# Patient Record
Sex: Male | Born: 2000 | Hispanic: Yes | Marital: Single
Health system: Midwestern US, Community
[De-identification: ages and names within clinical notes are randomized; demographics above are authoritative.]

## PROBLEM LIST (undated history)

## (undated) HISTORY — PX: APPENDECTOMY: SHX54

---
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
## 2021-06-07 DIAGNOSIS — R079 Chest pain, unspecified: Secondary | ICD-10-CM | POA: Diagnosis not present

## 2021-06-07 DIAGNOSIS — Z00129 Encounter for routine child health examination without abnormal findings: Secondary | ICD-10-CM | POA: Diagnosis not present

## 2021-06-22 DIAGNOSIS — R0602 Shortness of breath: Secondary | ICD-10-CM | POA: Diagnosis not present

## 2021-06-22 DIAGNOSIS — R079 Chest pain, unspecified: Secondary | ICD-10-CM | POA: Diagnosis not present

## 2021-06-22 DIAGNOSIS — R61 Generalized hyperhidrosis: Secondary | ICD-10-CM | POA: Diagnosis not present

## 2021-07-02 DIAGNOSIS — I071 Rheumatic tricuspid insufficiency: Secondary | ICD-10-CM | POA: Diagnosis not present

## 2021-07-02 DIAGNOSIS — R079 Chest pain, unspecified: Secondary | ICD-10-CM | POA: Diagnosis not present

## 2021-07-20 DIAGNOSIS — R079 Chest pain, unspecified: Secondary | ICD-10-CM | POA: Diagnosis not present

## 2021-08-31 DIAGNOSIS — R079 Chest pain, unspecified: Secondary | ICD-10-CM | POA: Diagnosis not present

## 2021-09-03 DIAGNOSIS — R109 Unspecified abdominal pain: Secondary | ICD-10-CM | POA: Diagnosis not present

## 2021-09-03 DIAGNOSIS — R8290 Unspecified abnormal findings in urine: Secondary | ICD-10-CM | POA: Diagnosis not present

## 2021-09-03 DIAGNOSIS — R110 Nausea: Secondary | ICD-10-CM | POA: Diagnosis not present

## 2021-09-03 DIAGNOSIS — R69 Illness, unspecified: Secondary | ICD-10-CM | POA: Diagnosis not present

## 2021-09-03 DIAGNOSIS — Z7251 High risk heterosexual behavior: Secondary | ICD-10-CM | POA: Diagnosis not present

## 2021-09-03 DIAGNOSIS — R300 Dysuria: Secondary | ICD-10-CM | POA: Diagnosis not present

## 2021-09-07 DIAGNOSIS — R079 Chest pain, unspecified: Secondary | ICD-10-CM | POA: Diagnosis not present

## 2021-09-21 DIAGNOSIS — R8290 Unspecified abnormal findings in urine: Secondary | ICD-10-CM | POA: Diagnosis not present

## 2021-09-21 DIAGNOSIS — R69 Illness, unspecified: Secondary | ICD-10-CM | POA: Diagnosis not present

## 2021-09-21 DIAGNOSIS — R319 Hematuria, unspecified: Secondary | ICD-10-CM | POA: Diagnosis not present

## 2021-11-30 DIAGNOSIS — Z20822 Contact with and (suspected) exposure to covid-19: Secondary | ICD-10-CM | POA: Diagnosis not present

## 2021-11-30 DIAGNOSIS — R070 Pain in throat: Secondary | ICD-10-CM | POA: Diagnosis not present

## 2021-11-30 DIAGNOSIS — H6693 Otitis media, unspecified, bilateral: Secondary | ICD-10-CM | POA: Diagnosis not present

## 2021-12-28 DIAGNOSIS — I4510 Unspecified right bundle-branch block: Secondary | ICD-10-CM | POA: Diagnosis not present

## 2021-12-28 DIAGNOSIS — R8290 Unspecified abnormal findings in urine: Secondary | ICD-10-CM | POA: Diagnosis not present

## 2021-12-28 DIAGNOSIS — K5900 Constipation, unspecified: Secondary | ICD-10-CM | POA: Diagnosis not present

## 2021-12-28 DIAGNOSIS — R300 Dysuria: Secondary | ICD-10-CM | POA: Diagnosis not present

## 2021-12-28 DIAGNOSIS — R079 Chest pain, unspecified: Secondary | ICD-10-CM | POA: Diagnosis not present

## 2021-12-28 DIAGNOSIS — R319 Hematuria, unspecified: Secondary | ICD-10-CM | POA: Diagnosis not present

## 2021-12-31 DIAGNOSIS — R0789 Other chest pain: Secondary | ICD-10-CM | POA: Diagnosis not present

## 2021-12-31 DIAGNOSIS — M940 Chondrocostal junction syndrome [Tietze]: Secondary | ICD-10-CM | POA: Diagnosis not present

## 2021-12-31 DIAGNOSIS — R079 Chest pain, unspecified: Secondary | ICD-10-CM | POA: Diagnosis not present

## 2021-12-31 DIAGNOSIS — R0600 Dyspnea, unspecified: Secondary | ICD-10-CM | POA: Diagnosis not present

## 2021-12-31 NOTE — Progress Notes (Signed)
Formatting of this note is different from the original.  Images from the original note were not included.      [place] [facility] [place], [place]        Date of Service: 12/31/2021    Return Patient Clinic Note    PCP: Referring Provider:   Jaci Standard, MD  [address] [facility]  [place] [address]  Phone: [phone]  Fax: [phone] Jaci Standard, MD  [address] [place] [address]  Phone: [phone]  Fax: [phone]     Assessment and Plan:     Mr. Vecchio is a 21 y.o. male with no significant past medical history who presents for followooup of chest pain.    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 Arko has previously been evaluated for constellation of symptoms (DOE, sharp atypical chest pain) in this clinic by Dr. Creola Corn. 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 Chest pain improved w/o intervention as of last visit in May. Has returned over past 2 wks.   Exam notable for reproducible parasternal tenderness. Pleuritic component. Also has separate sharp focal chest pain that occurs randomly.   EKG is unchanged from prior, no tachycardia or ischemic changes. Symptoms appear to be at least partially from costochondritis/MSK. Low suspicion for ischemic etiology or PE.      - Rx for naproxen 500 mg bid x 14 days  - Check CMP, CBC w/ diff, sed rate, CRP  - I will d/w Dr. Janith Lima about further imaging to workup AVM     Return to clinic: next available w/ Dr. Janith Lima    Subjective:       Reason for Visit: Chest pain/dyspnea    History of Present Illness: Mr. Stann is a 21 y.o. male with no significant past medical history who presents for the evaluation of chest pain.    Established March 2023  Patient has a longstanding history of episodes of chest pain and significant  shortness of breath with exertion.  At one point as a child he was labeled with exercise-induced asthma and had an inhaler which she said helped intermittently.  He has not had this inhaler in over 10 years.  At age 30 in 2019 he met with pediatric cardiology for these episodes and underwent echocardiogram which was fairly unremarkable with trivial TR and mild PI.  No clear etiology of his symptoms were found.  Today he presents as over the past 6 months he has had episodes of increasing frequency and severity.  generally they are brought on with exertion.  He develops a sharp stabbing chest pain that is worse with deep inspiration.  There is no associated cough. There is no radiation to his jaw though he said sometimes his left shoulder gets sore with this.  Ultimately he has to rest and take shallow breaths that he can the pain will go away after 5 to 10 minutes.  Occasionally he will have 1 of these episodes while at rest.      Last seen by Dr. Quenten Raven 08/31/21. Chest discomfort had dramatically improved. Echocardiogram showed low-grade intrapulmonary shunt with some late bubble crossing.     Here today w/ mom. Reports increased chest pain over past 2 wks occurring daily. Feels like someone stabbing in focal area in mid left parasternal area. Randomly happens. Lasts  about 5 min and then self resolves. Worse w/ deep breath. Hurts when he presses on it.   Not taking any pain medications.   No LH/dizziness, SOB, palpitations, or swelling.   He exercises. After running 30 min will notice some chest  discomfort.     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  No other significant abnormality    Stress test:  None    Cardiac catheterization:  None    Cardiovascular Surgery:  None    EP Procedures and Devices:  None    Non-Invasive Studies:    Relevant Past Medical History:  Active Ambulatory Problems     Diagnosis Date Noted     Appendicitis 12/21/2011     Resolved Ambulatory Problems     Diagnosis Date Noted    No Resolved Ambulatory Problems     Past Medical History:   Diagnosis Date    Heart murmur      Patient's Medications    No medications on file     Allergies:  No Known Allergies    Social History:  He  reports that he has never smoked. He has never used smokeless tobacco. He reports that he does not drink alcohol and does not use drugs.    Family History:  His family history includes Heart attack in his maternal grandfather.    Review of Systems  10 systems were reviewed and negative except as noted in HPI.     Objective:      Physical Exam  BP 127/78 (BP Site: L Arm, BP Position: Sitting, BP Cuff Size: Large)   Pulse 69   Ht 175.3 cm (_0 )   Wt 87.8 kg (193 lb 9.6 oz)   SpO2 99%   BMI 28.59 kg/m    Wt Readings from Last 3 Encounters:   12/31/21 87.8 kg (193 lb 9.6 oz) (89 %, Z= 1.24)*   08/31/21 91 kg (200 lb 9.6 oz) (93 %, Z= 1.45)*   06/22/21 90.7 kg (200 lb) (93 %, Z= 1.45)*     * Growth percentiles are based on CDC (Boys, 2-20 Years) data.     General:  In exam chair in no distress   HEENT:  EOMI, sclerae anicteric, MMM.   Neck: Supple, no carotid bruit. JVP not appreciably elevated   Lungs:   CTAB bilaterally with normal WOB.   Heart:  Regular rhythm, normal rate, normal S1/S2.  No appreciable murmur. Parasternal TTP   Abdomen:   Soft, non-tender, non-distended.   Extremities: 2+ radial pulses bilaterally. No edema .   Skin: No lesions/rashes.   Neurologic: No focal deficits.     Most recent labs     ECG continues to show significant right axis deviation and high voltage.      Electronically signed by Alexis Goodell, AGNP at 12/31/2021  1:10 PM EDT

## 2022-01-02 DIAGNOSIS — R079 Chest pain, unspecified: Secondary | ICD-10-CM | POA: Diagnosis not present

## 2022-03-03 DIAGNOSIS — R079 Chest pain, unspecified: Secondary | ICD-10-CM | POA: Diagnosis not present

## 2022-03-03 DIAGNOSIS — R0789 Other chest pain: Secondary | ICD-10-CM | POA: Diagnosis not present

## 2022-03-03 DIAGNOSIS — R0600 Dyspnea, unspecified: Secondary | ICD-10-CM | POA: Diagnosis not present

## 2022-03-08 DIAGNOSIS — R079 Chest pain, unspecified: Secondary | ICD-10-CM | POA: Diagnosis not present

## 2022-03-08 NOTE — Progress Notes (Signed)
Formatting of this note is different from the original.  Images from the original note were not included.      [place] [facility] [place], [place]        Date of Service: 03/08/2022    Return Patient Clinic Note    PCP: Referring Provider:   Jaci Standard, MD  [address] [facility] [place] [address]  Phone: [phone]  Fax: [phone] Jaci Standard, MD  [address] [place] [address]  Phone: [phone]  Fax: [phone]     Assessment and Plan:     Mr. Morrish is a 21 y.o. male with no significant past medical history who presents for follow-up of chest pain.    Atypical chest pain // costochondritis  Right axis deviation on ECG  Presented initially with DOE, sharp atypical chest pain. 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 CTA ordered (pulm AVM protocol) which did not show pulmonary AVM. Chest pain improved w/o intervention as of last visit in May. ERS/CRP unremarkable.   ECG today shows normal axis.   Symptoms remain intermittent and atypical. Description sounds potentially MSK in nature. Would also consider exercised induced asthma, though symptoms are predominantly sharp chest pain and less dyspnea.   He never picked up the naproxen which I feel is a good idea to try. This was re-ordered.  No further workup from a cardiac standpoint is warranted. Further workup per PCP.    Return to clinic: PRN    Subjective:       Reason for Visit: Chest pain/dyspnea    History of Present Illness: Mr. Gosnell is a 21 y.o. male with no significant past medical history who presents for the evaluation of chest pain.    Established March 2023  Patient has a longstanding history of episodes of chest pain and significant shortness of breath with exertion.  At one point as a child he was labeled with exercise-induced asthma and had an inhaler which she said helped intermittently.  He  has not had this inhaler in over 10 years.  At age 21 in 2019 he met with pediatric cardiology for these episodes and underwent echocardiogram which was fairly unremarkable with trivial TR and mild PI.  No clear etiology of his symptoms were found.  Today he presents as over the past 6 months he has had episodes of increasing frequency and severity.  generally they are brought on with exertion.  He develops a sharp stabbing chest pain that is worse with deep inspiration.  There is no associated cough. There is no radiation to his jaw though he said sometimes his left shoulder gets sore with this.  Ultimately he has to rest and take shallow breaths that he can the pain will go away after 5 to 10 minutes.  Occasionally he will have 1 of these episodes while at rest.      Last seen 12/31/21 with Cassie Ramm Here today w/ mom. Reports increased chest pain over past 2 wks occurring daily. Feels like someone stabbing in focal area in mid left parasternal area. Randomly happens. Lasts about 5 min and then self resolves. Worse w/ deep breath. Hurts when he presses on it.   Not taking any pain medications.   No LH/dizziness, SOB, palpitations, or swelling.   He exercises. After running 30 min will notice some chest  discomfort.     Underwent CTA AVM  protocol without AVMs noted. No significant coronary calcium present.     Today he says he is doing generally well. He continues to have the symptoms intermittently when working out. They come on suddenly. Continues to describe a sharp chest pain that resolves after 5 minutes or so. Happens with lifting weights or running. Overall frequency has improved. Says pain is also now generally reproducible with palpation.     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  No other significant abnormality    Stress test:  None    Cardiac catheterization:  None    Cardiovascular Surgery:  None    EP Procedures and  Devices:  None    Non-Invasive Studies:  CTA 02/2022 - No  pulmonary AVM.     Relevant Past Medical History:  Active Ambulatory Problems     Diagnosis Date Noted    Appendicitis 12/21/2011     Resolved Ambulatory Problems     Diagnosis Date Noted    No Resolved Ambulatory Problems     Past Medical History:   Diagnosis Date    Heart murmur      Patient's Medications   New Prescriptions    NAPROXEN (NAPROSYN) 500 MG TABLET    Take 1 tablet (500 mg total) by mouth in the morning and 1 tablet (500 mg total) in the evening. Take with meals.   Previous Medications    No medications on file   Modified Medications    No medications on file   Discontinued Medications    No medications on file     Allergies:  No Known Allergies    Social History:  He  reports that he has never smoked. He has never used smokeless tobacco. He reports that he does not drink alcohol and does not use drugs.    Family History:  His family history includes Heart attack in his maternal grandfather.    Review of Systems  10 systems were reviewed and negative except as noted in HPI.     Objective:      Physical Exam  BP 126/74 (BP Site: R Arm, BP Position: Sitting, BP Cuff Size: Large)   Pulse 91   Ht 175.3 cm (5' 9.02")   Wt 96.8 kg (213 lb 6.4 oz)   SpO2 98%   BMI 31.50 kg/m      General:  No distress sitting on exam table.   HEENT:  EOMI, sclerae anicteric, MMM.   Neck: Supple, no carotid bruit. JVP not appreciably elevated   Lungs:   CTAB bilaterally with normal WOB.   Heart:  Regular rhythm, normal rate, normal S1/S2.  No appreciable murmur. Parasternal TTP   Abdomen:   Soft, non-tender, non-distended.   Extremities: 2+ radial pulses bilaterally. No edema .   Skin: No lesions/rashes.   Neurologic: No focal deficits.     ECG shows normal axis today.      Electronically signed by Caro Laroche, MD at 03/08/2022  3:17 PM EST

## 2022-03-08 NOTE — Progress Notes (Signed)
Formatting of this note is different from the original.    I saw and evaluated the patient, participating in the key portions of the service.  I reviewed the resident?s note.  I agree with the resident?s findings and plan.     Aliene Beams, MD    Electronically signed by Marcelyn Bruins, MD at 03/09/2022  3:33 PM EST

## 2022-03-29 DIAGNOSIS — R079 Chest pain, unspecified: Secondary | ICD-10-CM | POA: Diagnosis not present

## 2022-04-18 ENCOUNTER — Inpatient Hospital Stay
Admit: 2022-04-18 | Discharge: 2022-04-18 | Disposition: A | Discharge status: Home / Self Care | Payer: PRIVATE HEALTH INSURANCE | Attending: Emergency Medicine

## 2022-04-18 DIAGNOSIS — R112 Nausea with vomiting, unspecified: Secondary | ICD-10-CM | POA: Diagnosis not present

## 2022-04-18 DIAGNOSIS — R000 Tachycardia, unspecified: Secondary | ICD-10-CM | POA: Diagnosis not present

## 2022-04-18 DIAGNOSIS — R1084 Generalized abdominal pain: Secondary | ICD-10-CM | POA: Diagnosis not present

## 2022-04-18 DIAGNOSIS — R197 Diarrhea, unspecified: Secondary | ICD-10-CM | POA: Diagnosis not present

## 2022-04-18 LAB — COMPREHENSIVE METABOLIC PANEL
ALT: 82 U/L — ABNORMAL HIGH (ref 12–65)
AST: 37 U/L (ref 15–37)
Albumin/Globulin Ratio: 1 (ref 0.4–1.6)
Albumin: 4.6 g/dL (ref 3.5–5.0)
Alk Phosphatase: 120 U/L (ref 50–136)
Anion Gap: 8 mmol/L (ref 2–11)
BUN: 22 MG/DL (ref 6–23)
CO2: 25 mmol/L (ref 21–32)
Calcium: 9.7 MG/DL (ref 8.3–10.4)
Chloride: 102 mmol/L — ABNORMAL LOW (ref 103–113)
Creatinine: 1.3 MG/DL (ref 0.8–1.5)
Est, Glom Filt Rate: >60 mL/min/{1.73_m2} (ref >60)
Globulin: 4.8 g/dL — ABNORMAL HIGH (ref 2.8–4.5)
Glucose: 113 mg/dL — ABNORMAL HIGH (ref 65–100)
Potassium: 3.9 mmol/L (ref 3.5–5.1)
Sodium: 135 mmol/L — ABNORMAL LOW (ref 136–146)
Total Bilirubin: 1.5 MG/DL — ABNORMAL HIGH (ref 0.2–1.1)
Total Protein: 9.4 g/dL — ABNORMAL HIGH (ref 6.3–8.2)

## 2022-04-18 LAB — EKG 12-LEAD
Atrial Rate: 93 {beats}/min
Diagnosis: NORMAL
P Axis: 27 degrees
P-R Interval: 126 ms
Q-T Interval: 352 ms
QRS Duration: 86 ms
QTc Calculation (Bazett): 437 ms
R Axis: 102 degrees
T Axis: 8 degrees
Ventricular Rate: 93 {beats}/min

## 2022-04-18 LAB — CBC WITH AUTO DIFFERENTIAL
Absolute Immature Granulocyte: 0.1 10*3/uL (ref 0.0–0.5)
Basophils %: 0 % (ref 0.0–2.0)
Basophils Absolute: 0.1 10*3/uL (ref 0.0–0.2)
Eosinophils %: 1 % (ref 0.5–7.8)
Eosinophils Absolute: 0.1 10*3/uL (ref 0.0–0.8)
Hematocrit: 51.8 % — ABNORMAL HIGH (ref 41.1–50.3)
Hemoglobin: 17.3 g/dL — ABNORMAL HIGH (ref 13.6–17.2)
Immature Granulocytes: 0 % (ref 0.0–5.0)
Lymphocytes %: 4 % — ABNORMAL LOW (ref 13–44)
Lymphocytes Absolute: 0.7 10*3/uL (ref 0.5–4.6)
MCH: 28.3 PG (ref 26.1–32.9)
MCHC: 33.4 g/dL (ref 31.4–35.0)
MCV: 84.8 FL (ref 82–102)
MPV: 9.6 FL (ref 9.4–12.3)
Monocytes %: 5 % (ref 4.0–12.0)
Monocytes Absolute: 0.7 10*3/uL (ref 0.1–1.3)
Neutrophils %: 90 % — ABNORMAL HIGH (ref 43–78)
Neutrophils Absolute: 13.7 10*3/uL — ABNORMAL HIGH (ref 1.7–8.2)
Platelets: 409 10*3/uL (ref 150–450)
RBC: 6.11 M/uL — ABNORMAL HIGH (ref 4.23–5.6)
RDW: 13 % (ref 11.9–14.6)
WBC: 15.3 10*3/uL — ABNORMAL HIGH (ref 4.3–11.1)
nRBC: 0 10*3/uL (ref 0.0–0.2)

## 2022-04-18 LAB — POCT URINALYSIS DIPSTICK
Glucose, UA POC: NEGATIVE mg/dL
Ketones, Urine, POC: >160 mg/dL — AB
Leukocyte Est, UA POC: NEGATIVE
Nitrite, Urine, POC: NEGATIVE
Protein, Urine, POC: 30 mg/dL — AB
Specific Gravity, Urine, POC: 1.025 — ABNORMAL HIGH (ref 1.001–1.023)
URINE UROBILINOGEN POC: 0.2 EU/dL (ref 0.2–1.0)
pH, Urine, POC: 5.5 (ref 5.0–9.0)

## 2022-04-18 LAB — COVID-19, RAPID: SARS-CoV-2, Rapid: NOT DETECTED

## 2022-04-18 LAB — URINALYSIS, MICRO
BACTERIA, URINE: 0 /hpf
Casts: 0 /lpf
Crystals: 0 /LPF
Epithelial Cells UA: 0 /hpf
Mucus, UA: 0 /lpf
RBC, UA: 0 /hpf

## 2022-04-18 LAB — LIPASE: Lipase: 73 U/L (ref 73–393)

## 2022-04-18 LAB — INFLUENZA A/B, MOLECULAR
Influenza A, NAA: NOT DETECTED
Influenza B, NAA: NOT DETECTED

## 2022-04-18 MED ORDER — DICYCLOMINE HCL 20 MG PO TABS
20 MG | ORAL_TABLET | Freq: Four times a day (QID) | ORAL | 0 refills | Status: DC
Start: 2022-04-18 — End: 2022-04-18

## 2022-04-18 MED ORDER — DICYCLOMINE HCL 20 MG PO TABS
20 MG | ORAL_TABLET | Freq: Four times a day (QID) | ORAL | 0 refills | Status: AC
Start: 2022-04-18 — End: 2022-04-23

## 2022-04-18 MED ORDER — ONDANSETRON HCL 4 MG PO TABS
4 MG | ORAL_TABLET | Freq: Three times a day (TID) | ORAL | 0 refills | Status: AC | PRN
Start: 2022-04-18 — End: ?

## 2022-04-18 MED ORDER — ONDANSETRON HCL 4 MG PO TABS
4 MG | ORAL_TABLET | Freq: Three times a day (TID) | ORAL | 0 refills | Status: DC | PRN
Start: 2022-04-18 — End: 2022-04-18

## 2022-04-18 MED ORDER — SODIUM CHLORIDE 0.9 % IV BOLUS
0.9 % | Freq: Once | INTRAVENOUS | Status: AC
Start: 2022-04-18 — End: 2022-04-18
  Administered 2022-04-18: 10:00:00 1000 mL via INTRAVENOUS

## 2022-04-18 MED ORDER — ONDANSETRON HCL 4 MG/2ML IJ SOLN
4 MG/2ML | Freq: Once | INTRAMUSCULAR | Status: AC
Start: 2022-04-18 — End: 2022-04-18
  Administered 2022-04-18: 10:00:00 4 mg via INTRAVENOUS

## 2022-04-18 MED ORDER — HYOSCYAMINE SULFATE 0.125 MG SL SUBL
125 MCG | SUBLINGUAL | Status: AC
Start: 2022-04-18 — End: 2022-04-18
  Administered 2022-04-18: 10:00:00 125 ug via SUBLINGUAL

## 2022-04-18 MED FILL — HYOSCYAMINE SULFATE 0.125 MG SL SUBL: 125 MCG | SUBLINGUAL | Qty: 1

## 2022-04-18 MED FILL — ONDANSETRON HCL 4 MG/2ML IJ SOLN: 4 MG/2ML | INTRAMUSCULAR | Qty: 2

## 2022-04-18 NOTE — ED Notes (Signed)
Pt states he has been having abd pain that is going up into his chest off and on    Pt states is having diarrhea and flu like symptoms      Estanislado Pandy, RN  04/18/22 [phone]

## 2022-04-18 NOTE — ED Provider Notes (Signed)
Emergency Department Provider Note       PCP: Unknown, Provider, DO   Age: 22 y.o.   Sex: male     DISPOSITION Decision To Discharge 04/18/2022 06:33:47 AM       ICD-10-CM    1. Nausea vomiting and diarrhea  R11.2     R19.7           Medical Decision Making     Complexity of Problems Addressed:  1 or more acute illnesses that pose a threat to life or bodily function.     Data Reviewed and Analyzed:   I independently ordered and reviewed each unique test.  I reviewed external records: ED visit note from an outside group.  I reviewed external records: provider visit note from PCP.       I independently ordered and interpreted the ED EKG in the absence of a Cardiologist.    Rate: 93  EKG Interpretation: EKG Interpretation: sinus rhythm, no evidence of arrhythmia  ST Segments: Normal ST segments - NO STEMI          Discussion of management or test interpretation.  See ED course below.      Risk of Complications and/or Morbidity of Patient Management:  Prescription drug management performed.  Patient was discharged risks and benefits of hospitalization were considered.  Shared medical decision making was utilized in creating the patients health plan today.    ED Course as of 04/18/22 0654   Mon Apr 18, 1140   4751 22 year old male presents with NVD and generalized abdominal cramping. No focal abdominal tenderness. Labs show WBC 15.3, likely elevated from multiple episodes of emesis. He has mildly elevated bilirubin with no abdominal tenderness. Feeling much better and able to tolerate PO after fluids, zofran and levsin. Will discharge with antiemetics and antispasmodics.  [CJ]      ED Course User Index  [CJ] Teodora Medici, APRN - CNP       Is this patient to be included in the SEP-1 core measure due to severe sepsis or septic shock? No Exclusion criteria - the patient is NOT to be included for SEP-1 Core Measure due to: Infection is not suspected      History      22 year old male presents with complaint of nausea  vomiting diarrhea worsening over the course of the day. Did call EMS to the residence for an episode of chest pain with vomiting.  Has had cramping abdominal pain throughout the day.  Reports that the chest pain subsided to the vomiting stopped.  Has been able to tolerate food.  Denies fever or chills.  Reports that he is from out of town and he is visiting this area.    The history is provided by the patient.        Review of Systems   Gastrointestinal:  Positive for abdominal pain, diarrhea, nausea and vomiting.   All other systems reviewed and are negative.      Physical Exam     Vitals signs and nursing note reviewed:  Vitals:    04/18/22 0440 04/18/22 0623   BP: (!) 115/105 113/62   Pulse: (!) 115 95   Resp: 16 18   Temp: 98.5 F (36.9 C) 99.3 F (37.4 C)   TempSrc: Oral Oral   SpO2: 99% 100%   Weight: 94.3 kg (208 lb)    Height: 1.753 m (_0 )       Physical Exam  Vitals and nursing note reviewed.  Constitutional:       Appearance: Normal appearance. He is ill-appearing.   HENT:      Head: Normocephalic and atraumatic.      Nose: Nose normal.      Mouth/Throat:      Mouth: Mucous membranes are moist.   Eyes:      Extraocular Movements: Extraocular movements intact.      Pupils: Pupils are equal, round, and reactive to light.   Cardiovascular:      Rate and Rhythm: Regular rhythm. Tachycardia present.      Pulses: Normal pulses.      Heart sounds: Normal heart sounds.   Pulmonary:      Effort: Pulmonary effort is normal.      Breath sounds: Normal breath sounds.   Abdominal:      General: Abdomen is flat. There is no distension.      Palpations: Abdomen is soft.      Tenderness: There is abdominal tenderness (Diffuse tenderness no focal tenderness). There is no right CVA tenderness or left CVA tenderness.   Musculoskeletal:         General: Normal range of motion.   Skin:     General: Skin is warm.      Capillary Refill: Capillary refill takes less than 2 seconds.   Neurological:      General: No focal  deficit present.      Mental Status: He is alert and oriented to person, place, and time.   Psychiatric:         Mood and Affect: Mood normal.         Behavior: Behavior normal.          Procedures     Procedures    Orders Placed This Encounter   Procedures    Influenza A/B, Molecular    COVID-19, Rapid    CBC with Diff    CMP    Lipase    Urinalysis, Micro    Diet NPO    POCT Urine Dipstick    POCT Urinalysis no Micro    EKG 12 Lead    Saline lock IV        Medications given during this emergency department visit:  Medications   sodium chloride 0.9 % bolus 1,000 mL (0 mLs IntraVENous Stopped 04/18/22 0648)   ondansetron (ZOFRAN) injection 4 mg (4 mg IntraVENous Given 04/18/22 0527)   hyoscyamine (LEVSIN/SL) sublingual tablet 125 mcg (125 mcg SubLINGual Given 04/18/22 0527)       Current Discharge Medication List        START taking these medications    Details   dicyclomine (BENTYL) 20 MG tablet Take 1 tablet by mouth 4 times daily for 5 days  Qty: 20 tablet, Refills: 0      ondansetron (ZOFRAN) 4 MG tablet Take 1 tablet by mouth 3 times daily as needed for Nausea or Vomiting  Qty: 15 tablet, Refills: 0              No past medical history on file.     No past surgical history on file.     Social History     Socioeconomic History    Marital status: Single        Current Discharge Medication List           Results for orders placed or performed during the hospital encounter of 04/18/22   Influenza A/B, Molecular    Specimen: Nasopharyngeal   Result Value Ref Range  Influenza A, NAA Not detected NOTD      Influenza B, NAA Not detected NOTD     COVID-19, Rapid    Specimen: Nasopharyngeal   Result Value Ref Range    Source NASAL      SARS-CoV-2, Rapid Not detected NOTD     CBC with Diff   Result Value Ref Range    WBC 15.3 (H) 4.3 - 11.1 K/uL    RBC 6.11 (H) 4.23 - 5.6 M/uL    Hemoglobin 17.3 (H) 13.6 - 17.2 g/dL    Hematocrit 51.8 (H) 41.1 - 50.3 %    MCV 84.8 82 - 102 FL    MCH 28.3 26.1 - 32.9 PG    MCHC 33.4 31.4 -  35.0 g/dL    RDW 13.0 11.9 - 14.6 %    Platelets 409 150 - 450 K/uL    MPV 9.6 9.4 - 12.3 FL    nRBC 0.00 0.0 - 0.2 K/uL    Differential Type AUTOMATED      Neutrophils % 90 (H) 43 - 78 %    Lymphocytes % 4 (L) 13 - 44 %    Monocytes % 5 4.0 - 12.0 %    Eosinophils % 1 0.5 - 7.8 %    Basophils % 0 0.0 - 2.0 %    Immature Granulocytes 0 0.0 - 5.0 %    Neutrophils Absolute 13.7 (H) 1.7 - 8.2 K/UL    Lymphocytes Absolute 0.7 0.5 - 4.6 K/UL    Monocytes Absolute 0.7 0.1 - 1.3 K/UL    Eosinophils Absolute 0.1 0.0 - 0.8 K/UL    Basophils Absolute 0.1 0.0 - 0.2 K/UL    Absolute Immature Granulocyte 0.1 0.0 - 0.5 K/UL   CMP   Result Value Ref Range    Sodium 135 (L) 136 - 146 mmol/L    Potassium 3.9 3.5 - 5.1 mmol/L    Chloride 102 (L) 103 - 113 mmol/L    CO2 25 21 - 32 mmol/L    Anion Gap 8 2 - 11 mmol/L    Glucose 113 (H) 65 - 100 mg/dL    BUN 22 6 - 23 MG/DL    Creatinine 1.30 0.8 - 1.5 MG/DL    Est, Glom Filt Rate >60 >60 ml/min/1.66m    Calcium 9.7 8.3 - 10.4 MG/DL    Total Bilirubin 1.5 (H) 0.2 - 1.1 MG/DL    ALT 82 (H) 12 - 65 U/L    AST 37 15 - 37 U/L    Alk Phosphatase 120 50 - 136 U/L    Total Protein 9.4 (H) 6.3 - 8.2 g/dL    Albumin 4.6 3.5 - 5.0 g/dL    Globulin 4.8 (H) 2.8 - 4.5 g/dL    Albumin/Globulin Ratio 1.0 0.4 - 1.6     Lipase   Result Value Ref Range    Lipase 73 73 - 393 U/L   Urinalysis, Micro   Result Value Ref Range    WBC, UA 0-3 0 /hpf    RBC, UA 0 0 /hpf    Epithelial Cells UA 0 0 /hpf    BACTERIA, URINE 0 0 /hpf    Casts 0 0 /lpf    Crystals 0 0 /LPF    Mucus, UA 0 0 /lpf    Other observations RESULTS VERIFIED MANUALLY     POCT Urinalysis no Micro   Result Value Ref Range    Specific Gravity, Urine, POC 1.025 (H) 1.001 - 1.023  pH, Urine, POC 5.5 5.0 - 9.0      Protein, Urine, POC 30 (A) NEG mg/dL    Glucose, UA POC Negative NEG mg/dL    Ketones, Urine, POC >160 (A) NEG mg/dL    Bilirubin, Urine, POC SMALL (A) NEG      Blood, UA POC MODERATE (A) NEG      URINE UROBILINOGEN POC 0.2 0.2 -  1.0 EU/dL    Nitrite, Urine, POC Negative NEG      Leukocyte Est, UA POC Negative NEG      Performed by: Pablo Ledger    EKG 12 Lead   Result Value Ref Range    Ventricular Rate 93 BPM    Atrial Rate 93 BPM    P-R Interval 126 ms    QRS Duration 86 ms    Q-T Interval 352 ms    QTc Calculation (Bazett) 437 ms    P Axis 27 degrees    R Axis 102 degrees    T Axis 8 degrees    Diagnosis       Normal sinus rhythm  Rightward axis  Poor R wave progression  Abnormal ECG  No previous ECGs available  Confirmed by Gracelyn Nurse  MD (UC), A. THOMAS ([id number]) on 04/18/2022 6:07:45 AM          No orders to display                     Voice dictation software was used during the making of this note.  This software is not perfect and grammatical and other typographical errors may be present.  This note has not been completely proofread for errors.     Teodora Medici, APRN - CNP  04/18/22 [phone]

## 2022-04-18 NOTE — Discharge Instructions (Addendum)
Use bentyl and zofran as needed for abdominal pain and cramping. Return for any worsening symptoms or concerns.

## 2022-04-18 NOTE — ED Notes (Signed)
I have reviewed discharge instructions with the patient.  The patient verbalized understanding.    Patient left ED via Discharge Method: ambulatory to Home with family.    Opportunity for questions and clarification provided.       Patient given 0 scripts.         To continue your aftercare when you leave the hospital, you may receive an automated call from our care team to check in on how you are doing.  This is a free service and part of our promise to provide the best care and service to meet your aftercare needs." If you have questions, or wish to unsubscribe from this service please call [phone].  Thank you for Choosing our [facility] Emergency Department.        Pablo Ledger, LPN  11/91/47 [id number]

## 2022-11-12 DIAGNOSIS — J95821 Acute postprocedural respiratory failure: Secondary | ICD-10-CM | POA: Diagnosis not present

## 2022-11-12 DIAGNOSIS — K56609 Unspecified intestinal obstruction, unspecified as to partial versus complete obstruction: Secondary | ICD-10-CM | POA: Diagnosis not present

## 2022-11-12 DIAGNOSIS — J9600 Acute respiratory failure, unspecified whether with hypoxia or hypercapnia: Secondary | ICD-10-CM | POA: Diagnosis not present

## 2022-11-12 DIAGNOSIS — R140 Abdominal distension (gaseous): Secondary | ICD-10-CM | POA: Diagnosis not present

## 2022-11-12 DIAGNOSIS — R1310 Dysphagia, unspecified: Secondary | ICD-10-CM | POA: Diagnosis not present

## 2022-11-12 DIAGNOSIS — R112 Nausea with vomiting, unspecified: Secondary | ICD-10-CM | POA: Diagnosis not present

## 2022-11-12 DIAGNOSIS — R0602 Shortness of breath: Secondary | ICD-10-CM | POA: Diagnosis not present

## 2022-11-12 DIAGNOSIS — S42002A Fracture of unspecified part of left clavicle, initial encounter for closed fracture: Secondary | ICD-10-CM | POA: Diagnosis not present

## 2022-11-12 DIAGNOSIS — S069XAA Unspecified intracranial injury with loss of consciousness status unknown, initial encounter: Secondary | ICD-10-CM | POA: Diagnosis not present

## 2022-11-12 DIAGNOSIS — S2220XA Unspecified fracture of sternum, initial encounter for closed fracture: Secondary | ICD-10-CM | POA: Diagnosis not present

## 2022-11-12 DIAGNOSIS — S199XXA Unspecified injury of neck, initial encounter: Secondary | ICD-10-CM | POA: Diagnosis not present

## 2022-11-12 DIAGNOSIS — S270XXA Traumatic pneumothorax, initial encounter: Secondary | ICD-10-CM | POA: Diagnosis not present

## 2022-11-12 DIAGNOSIS — G9340 Encephalopathy, unspecified: Secondary | ICD-10-CM | POA: Diagnosis not present

## 2022-11-12 DIAGNOSIS — R601 Generalized edema: Secondary | ICD-10-CM | POA: Diagnosis not present

## 2022-11-12 DIAGNOSIS — S3991XA Unspecified injury of abdomen, initial encounter: Secondary | ICD-10-CM | POA: Diagnosis not present

## 2022-11-12 DIAGNOSIS — I82622 Acute embolism and thrombosis of deep veins of left upper extremity: Secondary | ICD-10-CM | POA: Diagnosis not present

## 2022-11-12 DIAGNOSIS — Z452 Encounter for adjustment and management of vascular access device: Secondary | ICD-10-CM | POA: Diagnosis not present

## 2022-11-12 DIAGNOSIS — S0993XA Unspecified injury of face, initial encounter: Secondary | ICD-10-CM | POA: Diagnosis not present

## 2022-11-12 DIAGNOSIS — E8729 Other acidosis: Secondary | ICD-10-CM | POA: Diagnosis not present

## 2022-11-12 DIAGNOSIS — G8911 Acute pain due to trauma: Secondary | ICD-10-CM | POA: Diagnosis not present

## 2022-11-12 DIAGNOSIS — R0600 Dyspnea, unspecified: Secondary | ICD-10-CM | POA: Diagnosis not present

## 2022-11-12 DIAGNOSIS — R569 Unspecified convulsions: Secondary | ICD-10-CM | POA: Diagnosis not present

## 2022-11-12 DIAGNOSIS — S062XAA Diffuse traumatic brain injury with loss of consciousness status unknown, initial encounter: Secondary | ICD-10-CM | POA: Diagnosis not present

## 2022-11-12 DIAGNOSIS — J939 Pneumothorax, unspecified: Secondary | ICD-10-CM | POA: Diagnosis not present

## 2022-11-12 DIAGNOSIS — S301XXA Contusion of abdominal wall, initial encounter: Secondary | ICD-10-CM | POA: Diagnosis not present

## 2022-11-12 DIAGNOSIS — R338 Other retention of urine: Secondary | ICD-10-CM | POA: Diagnosis not present

## 2022-11-12 DIAGNOSIS — S299XXA Unspecified injury of thorax, initial encounter: Secondary | ICD-10-CM | POA: Diagnosis not present

## 2022-11-12 DIAGNOSIS — R600 Localized edema: Secondary | ICD-10-CM | POA: Diagnosis not present

## 2022-11-12 DIAGNOSIS — G40909 Epilepsy, unspecified, not intractable, without status epilepticus: Secondary | ICD-10-CM | POA: Diagnosis not present

## 2022-11-12 DIAGNOSIS — R29818 Other symptoms and signs involving the nervous system: Secondary | ICD-10-CM | POA: Diagnosis not present

## 2022-11-12 DIAGNOSIS — J189 Pneumonia, unspecified organism: Secondary | ICD-10-CM | POA: Diagnosis not present

## 2022-11-12 DIAGNOSIS — K3189 Other diseases of stomach and duodenum: Secondary | ICD-10-CM | POA: Diagnosis not present

## 2022-11-12 DIAGNOSIS — S61412A Laceration without foreign body of left hand, initial encounter: Secondary | ICD-10-CM | POA: Diagnosis not present

## 2022-11-12 DIAGNOSIS — D6869 Other thrombophilia: Secondary | ICD-10-CM | POA: Diagnosis not present

## 2022-11-12 DIAGNOSIS — S60222A Contusion of left hand, initial encounter: Secondary | ICD-10-CM | POA: Diagnosis not present

## 2022-11-12 DIAGNOSIS — J9811 Atelectasis: Secondary | ICD-10-CM | POA: Diagnosis not present

## 2022-11-12 DIAGNOSIS — S2239XA Fracture of one rib, unspecified side, initial encounter for closed fracture: Secondary | ICD-10-CM | POA: Diagnosis not present

## 2022-11-12 DIAGNOSIS — S61522A Laceration with foreign body of left wrist, initial encounter: Secondary | ICD-10-CM | POA: Diagnosis not present

## 2022-11-12 DIAGNOSIS — J9610 Chronic respiratory failure, unspecified whether with hypoxia or hypercapnia: Secondary | ICD-10-CM | POA: Diagnosis not present

## 2022-11-12 DIAGNOSIS — K567 Ileus, unspecified: Secondary | ICD-10-CM | POA: Diagnosis not present

## 2022-11-12 DIAGNOSIS — S6991XA Unspecified injury of right wrist, hand and finger(s), initial encounter: Secondary | ICD-10-CM | POA: Diagnosis not present

## 2022-11-12 DIAGNOSIS — S6992XA Unspecified injury of left wrist, hand and finger(s), initial encounter: Secondary | ICD-10-CM | POA: Diagnosis not present

## 2022-11-12 DIAGNOSIS — R4182 Altered mental status, unspecified: Secondary | ICD-10-CM | POA: Diagnosis not present

## 2022-11-12 DIAGNOSIS — D72829 Elevated white blood cell count, unspecified: Secondary | ICD-10-CM | POA: Diagnosis not present

## 2022-11-12 DIAGNOSIS — S2232XA Fracture of one rib, left side, initial encounter for closed fracture: Secondary | ICD-10-CM | POA: Diagnosis not present

## 2022-11-12 DIAGNOSIS — S060XAA Concussion with loss of consciousness status unknown, initial encounter: Secondary | ICD-10-CM | POA: Diagnosis not present

## 2022-11-12 DIAGNOSIS — R2232 Localized swelling, mass and lump, left upper limb: Secondary | ICD-10-CM | POA: Diagnosis not present

## 2022-11-12 DIAGNOSIS — Z4682 Encounter for fitting and adjustment of non-vascular catheter: Secondary | ICD-10-CM | POA: Diagnosis not present

## 2022-11-12 DIAGNOSIS — S61519A Laceration without foreign body of unspecified wrist, initial encounter: Secondary | ICD-10-CM | POA: Diagnosis not present

## 2022-11-12 DIAGNOSIS — S6292XA Unspecified fracture of left wrist and hand, initial encounter for closed fracture: Secondary | ICD-10-CM | POA: Diagnosis not present

## 2022-11-12 DIAGNOSIS — M542 Cervicalgia: Secondary | ICD-10-CM | POA: Diagnosis not present

## 2022-11-12 DIAGNOSIS — Z4659 Encounter for fitting and adjustment of other gastrointestinal appliance and device: Secondary | ICD-10-CM | POA: Diagnosis not present

## 2022-11-12 DIAGNOSIS — R561 Post traumatic seizures: Secondary | ICD-10-CM | POA: Diagnosis not present

## 2022-11-12 DIAGNOSIS — S0990XA Unspecified injury of head, initial encounter: Secondary | ICD-10-CM | POA: Diagnosis not present

## 2022-11-12 DIAGNOSIS — S51812A Laceration without foreign body of left forearm, initial encounter: Secondary | ICD-10-CM | POA: Diagnosis not present

## 2022-11-12 DIAGNOSIS — T1490XA Injury, unspecified, initial encounter: Secondary | ICD-10-CM | POA: Diagnosis not present

## 2022-11-13 DIAGNOSIS — E8729 Other acidosis: Secondary | ICD-10-CM | POA: Diagnosis not present

## 2022-11-13 DIAGNOSIS — S2232XA Fracture of one rib, left side, initial encounter for closed fracture: Secondary | ICD-10-CM | POA: Diagnosis not present

## 2022-11-13 DIAGNOSIS — R0602 Shortness of breath: Secondary | ICD-10-CM | POA: Diagnosis not present

## 2022-11-13 DIAGNOSIS — S42002A Fracture of unspecified part of left clavicle, initial encounter for closed fracture: Secondary | ICD-10-CM | POA: Diagnosis not present

## 2022-11-13 DIAGNOSIS — S2239XA Fracture of one rib, unspecified side, initial encounter for closed fracture: Secondary | ICD-10-CM | POA: Diagnosis not present

## 2022-11-13 DIAGNOSIS — S060XAA Concussion with loss of consciousness status unknown, initial encounter: Secondary | ICD-10-CM | POA: Diagnosis not present

## 2022-11-13 DIAGNOSIS — S2220XA Unspecified fracture of sternum, initial encounter for closed fracture: Secondary | ICD-10-CM | POA: Diagnosis not present

## 2022-11-13 DIAGNOSIS — R601 Generalized edema: Secondary | ICD-10-CM | POA: Diagnosis not present

## 2022-11-13 DIAGNOSIS — G8911 Acute pain due to trauma: Secondary | ICD-10-CM | POA: Diagnosis not present

## 2022-11-13 DIAGNOSIS — S270XXA Traumatic pneumothorax, initial encounter: Secondary | ICD-10-CM | POA: Diagnosis not present

## 2022-11-13 DIAGNOSIS — R561 Post traumatic seizures: Secondary | ICD-10-CM | POA: Diagnosis not present

## 2022-11-13 DIAGNOSIS — S062XAA Diffuse traumatic brain injury with loss of consciousness status unknown, initial encounter: Secondary | ICD-10-CM | POA: Diagnosis not present

## 2022-11-14 DIAGNOSIS — S42002A Fracture of unspecified part of left clavicle, initial encounter for closed fracture: Secondary | ICD-10-CM | POA: Diagnosis not present

## 2022-11-14 DIAGNOSIS — S270XXA Traumatic pneumothorax, initial encounter: Secondary | ICD-10-CM | POA: Diagnosis not present

## 2022-11-14 DIAGNOSIS — S062XAA Diffuse traumatic brain injury with loss of consciousness status unknown, initial encounter: Secondary | ICD-10-CM | POA: Diagnosis not present

## 2022-11-14 DIAGNOSIS — R569 Unspecified convulsions: Secondary | ICD-10-CM | POA: Diagnosis not present

## 2022-11-14 DIAGNOSIS — S2220XA Unspecified fracture of sternum, initial encounter for closed fracture: Secondary | ICD-10-CM | POA: Diagnosis not present

## 2022-11-14 DIAGNOSIS — S2239XA Fracture of one rib, unspecified side, initial encounter for closed fracture: Secondary | ICD-10-CM | POA: Diagnosis not present

## 2022-11-14 DIAGNOSIS — R561 Post traumatic seizures: Secondary | ICD-10-CM | POA: Diagnosis not present

## 2022-11-14 DIAGNOSIS — E8729 Other acidosis: Secondary | ICD-10-CM | POA: Diagnosis not present

## 2022-11-14 DIAGNOSIS — G8911 Acute pain due to trauma: Secondary | ICD-10-CM | POA: Diagnosis not present

## 2022-11-14 DIAGNOSIS — S2232XA Fracture of one rib, left side, initial encounter for closed fracture: Secondary | ICD-10-CM | POA: Diagnosis not present

## 2022-11-14 DIAGNOSIS — D6869 Other thrombophilia: Secondary | ICD-10-CM | POA: Diagnosis not present

## 2022-11-14 DIAGNOSIS — R4182 Altered mental status, unspecified: Secondary | ICD-10-CM | POA: Diagnosis not present

## 2022-11-15 DIAGNOSIS — J9690 Respiratory failure, unspecified, unspecified whether with hypoxia or hypercapnia: Secondary | ICD-10-CM | POA: Diagnosis not present

## 2022-11-15 DIAGNOSIS — G8911 Acute pain due to trauma: Secondary | ICD-10-CM | POA: Diagnosis not present

## 2022-11-15 DIAGNOSIS — G9340 Encephalopathy, unspecified: Secondary | ICD-10-CM | POA: Diagnosis not present

## 2022-11-15 DIAGNOSIS — R561 Post traumatic seizures: Secondary | ICD-10-CM | POA: Diagnosis not present

## 2022-11-15 DIAGNOSIS — S2220XA Unspecified fracture of sternum, initial encounter for closed fracture: Secondary | ICD-10-CM | POA: Diagnosis not present

## 2022-11-15 DIAGNOSIS — S270XXA Traumatic pneumothorax, initial encounter: Secondary | ICD-10-CM | POA: Diagnosis not present

## 2022-11-15 DIAGNOSIS — S42002A Fracture of unspecified part of left clavicle, initial encounter for closed fracture: Secondary | ICD-10-CM | POA: Diagnosis not present

## 2022-11-15 DIAGNOSIS — M542 Cervicalgia: Secondary | ICD-10-CM | POA: Diagnosis not present

## 2022-11-15 DIAGNOSIS — R600 Localized edema: Secondary | ICD-10-CM | POA: Diagnosis not present

## 2022-11-15 DIAGNOSIS — S0990XA Unspecified injury of head, initial encounter: Secondary | ICD-10-CM | POA: Diagnosis not present

## 2022-11-15 DIAGNOSIS — S199XXA Unspecified injury of neck, initial encounter: Secondary | ICD-10-CM | POA: Diagnosis not present

## 2022-11-15 DIAGNOSIS — D6869 Other thrombophilia: Secondary | ICD-10-CM | POA: Diagnosis not present

## 2022-11-15 DIAGNOSIS — E8729 Other acidosis: Secondary | ICD-10-CM | POA: Diagnosis not present

## 2022-11-15 DIAGNOSIS — S062XAA Diffuse traumatic brain injury with loss of consciousness status unknown, initial encounter: Secondary | ICD-10-CM | POA: Diagnosis not present

## 2022-11-15 DIAGNOSIS — S2232XA Fracture of one rib, left side, initial encounter for closed fracture: Secondary | ICD-10-CM | POA: Diagnosis not present

## 2022-11-16 DIAGNOSIS — E8729 Other acidosis: Secondary | ICD-10-CM | POA: Diagnosis not present

## 2022-11-16 DIAGNOSIS — K3189 Other diseases of stomach and duodenum: Secondary | ICD-10-CM | POA: Diagnosis not present

## 2022-11-16 DIAGNOSIS — D6869 Other thrombophilia: Secondary | ICD-10-CM | POA: Diagnosis not present

## 2022-11-16 DIAGNOSIS — R0600 Dyspnea, unspecified: Secondary | ICD-10-CM | POA: Diagnosis not present

## 2022-11-16 DIAGNOSIS — S42002A Fracture of unspecified part of left clavicle, initial encounter for closed fracture: Secondary | ICD-10-CM | POA: Diagnosis not present

## 2022-11-16 DIAGNOSIS — S062XAA Diffuse traumatic brain injury with loss of consciousness status unknown, initial encounter: Secondary | ICD-10-CM | POA: Diagnosis not present

## 2022-11-16 DIAGNOSIS — S2232XA Fracture of one rib, left side, initial encounter for closed fracture: Secondary | ICD-10-CM | POA: Diagnosis not present

## 2022-11-16 DIAGNOSIS — S069XAA Unspecified intracranial injury with loss of consciousness status unknown, initial encounter: Secondary | ICD-10-CM | POA: Diagnosis not present

## 2022-11-16 DIAGNOSIS — S299XXA Unspecified injury of thorax, initial encounter: Secondary | ICD-10-CM | POA: Diagnosis not present

## 2022-11-16 DIAGNOSIS — R561 Post traumatic seizures: Secondary | ICD-10-CM | POA: Diagnosis not present

## 2022-11-16 DIAGNOSIS — S270XXA Traumatic pneumothorax, initial encounter: Secondary | ICD-10-CM | POA: Diagnosis not present

## 2022-11-16 DIAGNOSIS — G8911 Acute pain due to trauma: Secondary | ICD-10-CM | POA: Diagnosis not present

## 2022-11-16 DIAGNOSIS — S2220XA Unspecified fracture of sternum, initial encounter for closed fracture: Secondary | ICD-10-CM | POA: Diagnosis not present

## 2022-11-17 DIAGNOSIS — D6869 Other thrombophilia: Secondary | ICD-10-CM | POA: Diagnosis not present

## 2022-11-17 DIAGNOSIS — R561 Post traumatic seizures: Secondary | ICD-10-CM | POA: Diagnosis not present

## 2022-11-17 DIAGNOSIS — S270XXA Traumatic pneumothorax, initial encounter: Secondary | ICD-10-CM | POA: Diagnosis not present

## 2022-11-17 DIAGNOSIS — S2220XA Unspecified fracture of sternum, initial encounter for closed fracture: Secondary | ICD-10-CM | POA: Diagnosis not present

## 2022-11-17 DIAGNOSIS — S062XAA Diffuse traumatic brain injury with loss of consciousness status unknown, initial encounter: Secondary | ICD-10-CM | POA: Diagnosis not present

## 2022-11-17 DIAGNOSIS — Z4682 Encounter for fitting and adjustment of non-vascular catheter: Secondary | ICD-10-CM | POA: Diagnosis not present

## 2022-11-17 DIAGNOSIS — S2232XA Fracture of one rib, left side, initial encounter for closed fracture: Secondary | ICD-10-CM | POA: Diagnosis not present

## 2022-11-17 DIAGNOSIS — S069XAA Unspecified intracranial injury with loss of consciousness status unknown, initial encounter: Secondary | ICD-10-CM | POA: Diagnosis not present

## 2022-11-17 DIAGNOSIS — S42002A Fracture of unspecified part of left clavicle, initial encounter for closed fracture: Secondary | ICD-10-CM | POA: Diagnosis not present

## 2022-11-17 DIAGNOSIS — G8911 Acute pain due to trauma: Secondary | ICD-10-CM | POA: Diagnosis not present

## 2022-11-17 DIAGNOSIS — E8729 Other acidosis: Secondary | ICD-10-CM | POA: Diagnosis not present

## 2022-11-18 DIAGNOSIS — S062XAA Diffuse traumatic brain injury with loss of consciousness status unknown, initial encounter: Secondary | ICD-10-CM | POA: Diagnosis not present

## 2022-11-18 DIAGNOSIS — D6869 Other thrombophilia: Secondary | ICD-10-CM | POA: Diagnosis not present

## 2022-11-18 DIAGNOSIS — Z4659 Encounter for fitting and adjustment of other gastrointestinal appliance and device: Secondary | ICD-10-CM | POA: Diagnosis not present

## 2022-11-18 DIAGNOSIS — J95821 Acute postprocedural respiratory failure: Secondary | ICD-10-CM | POA: Diagnosis not present

## 2022-11-18 DIAGNOSIS — E8729 Other acidosis: Secondary | ICD-10-CM | POA: Diagnosis not present

## 2022-11-18 DIAGNOSIS — S270XXA Traumatic pneumothorax, initial encounter: Secondary | ICD-10-CM | POA: Diagnosis not present

## 2022-11-18 DIAGNOSIS — S069XAA Unspecified intracranial injury with loss of consciousness status unknown, initial encounter: Secondary | ICD-10-CM | POA: Diagnosis not present

## 2022-11-18 DIAGNOSIS — S42002A Fracture of unspecified part of left clavicle, initial encounter for closed fracture: Secondary | ICD-10-CM | POA: Diagnosis not present

## 2022-11-18 DIAGNOSIS — J939 Pneumothorax, unspecified: Secondary | ICD-10-CM | POA: Diagnosis not present

## 2022-11-18 DIAGNOSIS — Z452 Encounter for adjustment and management of vascular access device: Secondary | ICD-10-CM | POA: Diagnosis not present

## 2022-11-18 DIAGNOSIS — S299XXA Unspecified injury of thorax, initial encounter: Secondary | ICD-10-CM | POA: Diagnosis not present

## 2022-11-18 DIAGNOSIS — G8911 Acute pain due to trauma: Secondary | ICD-10-CM | POA: Diagnosis not present

## 2022-11-18 DIAGNOSIS — J9610 Chronic respiratory failure, unspecified whether with hypoxia or hypercapnia: Secondary | ICD-10-CM | POA: Diagnosis not present

## 2022-11-18 DIAGNOSIS — R1310 Dysphagia, unspecified: Secondary | ICD-10-CM | POA: Diagnosis not present

## 2022-11-18 DIAGNOSIS — S2232XA Fracture of one rib, left side, initial encounter for closed fracture: Secondary | ICD-10-CM | POA: Diagnosis not present

## 2022-11-18 DIAGNOSIS — S2220XA Unspecified fracture of sternum, initial encounter for closed fracture: Secondary | ICD-10-CM | POA: Diagnosis not present

## 2022-11-18 DIAGNOSIS — R561 Post traumatic seizures: Secondary | ICD-10-CM | POA: Diagnosis not present

## 2022-11-18 DIAGNOSIS — R112 Nausea with vomiting, unspecified: Secondary | ICD-10-CM | POA: Diagnosis not present

## 2022-11-18 DIAGNOSIS — S0990XA Unspecified injury of head, initial encounter: Secondary | ICD-10-CM | POA: Diagnosis not present

## 2022-11-19 DIAGNOSIS — R338 Other retention of urine: Secondary | ICD-10-CM | POA: Diagnosis not present

## 2022-11-19 DIAGNOSIS — S2232XA Fracture of one rib, left side, initial encounter for closed fracture: Secondary | ICD-10-CM | POA: Diagnosis not present

## 2022-11-19 DIAGNOSIS — R112 Nausea with vomiting, unspecified: Secondary | ICD-10-CM | POA: Diagnosis not present

## 2022-11-19 DIAGNOSIS — R561 Post traumatic seizures: Secondary | ICD-10-CM | POA: Diagnosis not present

## 2022-11-19 DIAGNOSIS — E8729 Other acidosis: Secondary | ICD-10-CM | POA: Diagnosis not present

## 2022-11-19 DIAGNOSIS — G8911 Acute pain due to trauma: Secondary | ICD-10-CM | POA: Diagnosis not present

## 2022-11-19 DIAGNOSIS — S062XAA Diffuse traumatic brain injury with loss of consciousness status unknown, initial encounter: Secondary | ICD-10-CM | POA: Diagnosis not present

## 2022-11-19 DIAGNOSIS — S270XXA Traumatic pneumothorax, initial encounter: Secondary | ICD-10-CM | POA: Diagnosis not present

## 2022-11-19 DIAGNOSIS — J9600 Acute respiratory failure, unspecified whether with hypoxia or hypercapnia: Secondary | ICD-10-CM | POA: Diagnosis not present

## 2022-11-19 DIAGNOSIS — S2220XA Unspecified fracture of sternum, initial encounter for closed fracture: Secondary | ICD-10-CM | POA: Diagnosis not present

## 2022-11-19 DIAGNOSIS — S42002A Fracture of unspecified part of left clavicle, initial encounter for closed fracture: Secondary | ICD-10-CM | POA: Diagnosis not present

## 2022-11-19 DIAGNOSIS — D6869 Other thrombophilia: Secondary | ICD-10-CM | POA: Diagnosis not present

## 2022-11-19 DIAGNOSIS — S299XXA Unspecified injury of thorax, initial encounter: Secondary | ICD-10-CM | POA: Diagnosis not present

## 2022-11-19 DIAGNOSIS — J939 Pneumothorax, unspecified: Secondary | ICD-10-CM | POA: Diagnosis not present

## 2022-11-19 DIAGNOSIS — S069XAA Unspecified intracranial injury with loss of consciousness status unknown, initial encounter: Secondary | ICD-10-CM | POA: Diagnosis not present

## 2022-11-19 DIAGNOSIS — K567 Ileus, unspecified: Secondary | ICD-10-CM | POA: Diagnosis not present

## 2022-11-20 DIAGNOSIS — Z4682 Encounter for fitting and adjustment of non-vascular catheter: Secondary | ICD-10-CM | POA: Diagnosis not present

## 2022-11-20 DIAGNOSIS — G8911 Acute pain due to trauma: Secondary | ICD-10-CM | POA: Diagnosis not present

## 2022-11-20 DIAGNOSIS — Z4659 Encounter for fitting and adjustment of other gastrointestinal appliance and device: Secondary | ICD-10-CM | POA: Diagnosis not present

## 2022-11-20 DIAGNOSIS — J939 Pneumothorax, unspecified: Secondary | ICD-10-CM | POA: Diagnosis not present

## 2022-11-20 DIAGNOSIS — S42002A Fracture of unspecified part of left clavicle, initial encounter for closed fracture: Secondary | ICD-10-CM | POA: Diagnosis not present

## 2022-11-20 DIAGNOSIS — E8729 Other acidosis: Secondary | ICD-10-CM | POA: Diagnosis not present

## 2022-11-20 DIAGNOSIS — S2232XA Fracture of one rib, left side, initial encounter for closed fracture: Secondary | ICD-10-CM | POA: Diagnosis not present

## 2022-11-20 DIAGNOSIS — S3991XA Unspecified injury of abdomen, initial encounter: Secondary | ICD-10-CM | POA: Diagnosis not present

## 2022-11-20 DIAGNOSIS — D6869 Other thrombophilia: Secondary | ICD-10-CM | POA: Diagnosis not present

## 2022-11-20 DIAGNOSIS — S069XAA Unspecified intracranial injury with loss of consciousness status unknown, initial encounter: Secondary | ICD-10-CM | POA: Diagnosis not present

## 2022-11-20 DIAGNOSIS — S299XXA Unspecified injury of thorax, initial encounter: Secondary | ICD-10-CM | POA: Diagnosis not present

## 2022-11-20 DIAGNOSIS — R561 Post traumatic seizures: Secondary | ICD-10-CM | POA: Diagnosis not present

## 2022-11-20 DIAGNOSIS — S2220XA Unspecified fracture of sternum, initial encounter for closed fracture: Secondary | ICD-10-CM | POA: Diagnosis not present

## 2022-11-20 DIAGNOSIS — S60222A Contusion of left hand, initial encounter: Secondary | ICD-10-CM | POA: Diagnosis not present

## 2022-11-20 DIAGNOSIS — S270XXA Traumatic pneumothorax, initial encounter: Secondary | ICD-10-CM | POA: Diagnosis not present

## 2022-11-20 DIAGNOSIS — S062XAA Diffuse traumatic brain injury with loss of consciousness status unknown, initial encounter: Secondary | ICD-10-CM | POA: Diagnosis not present

## 2022-11-20 DIAGNOSIS — S6292XA Unspecified fracture of left wrist and hand, initial encounter for closed fracture: Secondary | ICD-10-CM | POA: Diagnosis not present

## 2022-11-21 DIAGNOSIS — R140 Abdominal distension (gaseous): Secondary | ICD-10-CM | POA: Diagnosis not present

## 2022-11-21 DIAGNOSIS — E8729 Other acidosis: Secondary | ICD-10-CM | POA: Diagnosis not present

## 2022-11-21 DIAGNOSIS — S062XAA Diffuse traumatic brain injury with loss of consciousness status unknown, initial encounter: Secondary | ICD-10-CM | POA: Diagnosis not present

## 2022-11-21 DIAGNOSIS — D6869 Other thrombophilia: Secondary | ICD-10-CM | POA: Diagnosis not present

## 2022-11-21 DIAGNOSIS — R561 Post traumatic seizures: Secondary | ICD-10-CM | POA: Diagnosis not present

## 2022-11-21 DIAGNOSIS — S270XXA Traumatic pneumothorax, initial encounter: Secondary | ICD-10-CM | POA: Diagnosis not present

## 2022-11-21 DIAGNOSIS — S069XAA Unspecified intracranial injury with loss of consciousness status unknown, initial encounter: Secondary | ICD-10-CM | POA: Diagnosis not present

## 2022-11-21 DIAGNOSIS — S2220XA Unspecified fracture of sternum, initial encounter for closed fracture: Secondary | ICD-10-CM | POA: Diagnosis not present

## 2022-11-21 DIAGNOSIS — G8911 Acute pain due to trauma: Secondary | ICD-10-CM | POA: Diagnosis not present

## 2022-11-21 DIAGNOSIS — Z4682 Encounter for fitting and adjustment of non-vascular catheter: Secondary | ICD-10-CM | POA: Diagnosis not present

## 2022-11-21 DIAGNOSIS — S2232XA Fracture of one rib, left side, initial encounter for closed fracture: Secondary | ICD-10-CM | POA: Diagnosis not present

## 2022-11-21 DIAGNOSIS — S42002A Fracture of unspecified part of left clavicle, initial encounter for closed fracture: Secondary | ICD-10-CM | POA: Diagnosis not present

## 2022-11-22 DIAGNOSIS — S299XXA Unspecified injury of thorax, initial encounter: Secondary | ICD-10-CM | POA: Diagnosis not present

## 2022-11-22 DIAGNOSIS — S2232XA Fracture of one rib, left side, initial encounter for closed fracture: Secondary | ICD-10-CM | POA: Diagnosis not present

## 2022-11-22 DIAGNOSIS — D6869 Other thrombophilia: Secondary | ICD-10-CM | POA: Diagnosis not present

## 2022-11-22 DIAGNOSIS — G8911 Acute pain due to trauma: Secondary | ICD-10-CM | POA: Diagnosis not present

## 2022-11-22 DIAGNOSIS — Z4682 Encounter for fitting and adjustment of non-vascular catheter: Secondary | ICD-10-CM | POA: Diagnosis not present

## 2022-11-22 DIAGNOSIS — S2220XA Unspecified fracture of sternum, initial encounter for closed fracture: Secondary | ICD-10-CM | POA: Diagnosis not present

## 2022-11-22 DIAGNOSIS — K56609 Unspecified intestinal obstruction, unspecified as to partial versus complete obstruction: Secondary | ICD-10-CM | POA: Diagnosis not present

## 2022-11-22 DIAGNOSIS — S3991XA Unspecified injury of abdomen, initial encounter: Secondary | ICD-10-CM | POA: Diagnosis not present

## 2022-11-22 DIAGNOSIS — Z4659 Encounter for fitting and adjustment of other gastrointestinal appliance and device: Secondary | ICD-10-CM | POA: Diagnosis not present

## 2022-11-22 DIAGNOSIS — E8729 Other acidosis: Secondary | ICD-10-CM | POA: Diagnosis not present

## 2022-11-22 DIAGNOSIS — S069XAA Unspecified intracranial injury with loss of consciousness status unknown, initial encounter: Secondary | ICD-10-CM | POA: Diagnosis not present

## 2022-11-22 DIAGNOSIS — S062XAA Diffuse traumatic brain injury with loss of consciousness status unknown, initial encounter: Secondary | ICD-10-CM | POA: Diagnosis not present

## 2022-11-22 DIAGNOSIS — S270XXA Traumatic pneumothorax, initial encounter: Secondary | ICD-10-CM | POA: Diagnosis not present

## 2022-11-22 DIAGNOSIS — J939 Pneumothorax, unspecified: Secondary | ICD-10-CM | POA: Diagnosis not present

## 2022-11-22 DIAGNOSIS — R561 Post traumatic seizures: Secondary | ICD-10-CM | POA: Diagnosis not present

## 2022-11-22 DIAGNOSIS — S42002A Fracture of unspecified part of left clavicle, initial encounter for closed fracture: Secondary | ICD-10-CM | POA: Diagnosis not present

## 2022-11-23 DIAGNOSIS — Z452 Encounter for adjustment and management of vascular access device: Secondary | ICD-10-CM | POA: Diagnosis not present

## 2022-11-23 DIAGNOSIS — D6869 Other thrombophilia: Secondary | ICD-10-CM | POA: Diagnosis not present

## 2022-11-23 DIAGNOSIS — S2220XA Unspecified fracture of sternum, initial encounter for closed fracture: Secondary | ICD-10-CM | POA: Diagnosis not present

## 2022-11-23 DIAGNOSIS — S062XAA Diffuse traumatic brain injury with loss of consciousness status unknown, initial encounter: Secondary | ICD-10-CM | POA: Diagnosis not present

## 2022-11-23 DIAGNOSIS — S42002A Fracture of unspecified part of left clavicle, initial encounter for closed fracture: Secondary | ICD-10-CM | POA: Diagnosis not present

## 2022-11-23 DIAGNOSIS — R1310 Dysphagia, unspecified: Secondary | ICD-10-CM | POA: Diagnosis not present

## 2022-11-23 DIAGNOSIS — E8729 Other acidosis: Secondary | ICD-10-CM | POA: Diagnosis not present

## 2022-11-23 DIAGNOSIS — R561 Post traumatic seizures: Secondary | ICD-10-CM | POA: Diagnosis not present

## 2022-11-23 DIAGNOSIS — J189 Pneumonia, unspecified organism: Secondary | ICD-10-CM | POA: Diagnosis not present

## 2022-11-23 DIAGNOSIS — S270XXA Traumatic pneumothorax, initial encounter: Secondary | ICD-10-CM | POA: Diagnosis not present

## 2022-11-23 DIAGNOSIS — S069XAA Unspecified intracranial injury with loss of consciousness status unknown, initial encounter: Secondary | ICD-10-CM | POA: Diagnosis not present

## 2022-11-23 DIAGNOSIS — S2232XA Fracture of one rib, left side, initial encounter for closed fracture: Secondary | ICD-10-CM | POA: Diagnosis not present

## 2022-11-23 DIAGNOSIS — G8911 Acute pain due to trauma: Secondary | ICD-10-CM | POA: Diagnosis not present

## 2022-11-24 DIAGNOSIS — S062XAA Diffuse traumatic brain injury with loss of consciousness status unknown, initial encounter: Secondary | ICD-10-CM | POA: Diagnosis not present

## 2022-11-24 DIAGNOSIS — J9811 Atelectasis: Secondary | ICD-10-CM | POA: Diagnosis not present

## 2022-11-24 DIAGNOSIS — S2232XA Fracture of one rib, left side, initial encounter for closed fracture: Secondary | ICD-10-CM | POA: Diagnosis not present

## 2022-11-24 DIAGNOSIS — S2220XA Unspecified fracture of sternum, initial encounter for closed fracture: Secondary | ICD-10-CM | POA: Diagnosis not present

## 2022-11-24 DIAGNOSIS — R561 Post traumatic seizures: Secondary | ICD-10-CM | POA: Diagnosis not present

## 2022-11-24 DIAGNOSIS — J939 Pneumothorax, unspecified: Secondary | ICD-10-CM | POA: Diagnosis not present

## 2022-11-24 DIAGNOSIS — S270XXA Traumatic pneumothorax, initial encounter: Secondary | ICD-10-CM | POA: Diagnosis not present

## 2022-11-24 DIAGNOSIS — S42002A Fracture of unspecified part of left clavicle, initial encounter for closed fracture: Secondary | ICD-10-CM | POA: Diagnosis not present

## 2022-11-24 DIAGNOSIS — D6869 Other thrombophilia: Secondary | ICD-10-CM | POA: Diagnosis not present

## 2022-11-24 DIAGNOSIS — S069XAA Unspecified intracranial injury with loss of consciousness status unknown, initial encounter: Secondary | ICD-10-CM | POA: Diagnosis not present

## 2022-11-24 DIAGNOSIS — G8911 Acute pain due to trauma: Secondary | ICD-10-CM | POA: Diagnosis not present

## 2022-11-29 DIAGNOSIS — M25571 Pain in right ankle and joints of right foot: Secondary | ICD-10-CM | POA: Diagnosis not present

## 2022-11-29 DIAGNOSIS — M25512 Pain in left shoulder: Secondary | ICD-10-CM | POA: Diagnosis not present

## 2022-11-29 DIAGNOSIS — S62615A Displaced fracture of proximal phalanx of left ring finger, initial encounter for closed fracture: Secondary | ICD-10-CM | POA: Diagnosis not present

## 2022-11-29 DIAGNOSIS — S62613A Displaced fracture of proximal phalanx of left middle finger, initial encounter for closed fracture: Secondary | ICD-10-CM | POA: Diagnosis not present

## 2022-12-03 DIAGNOSIS — R000 Tachycardia, unspecified: Secondary | ICD-10-CM | POA: Diagnosis not present

## 2022-12-03 DIAGNOSIS — J984 Other disorders of lung: Secondary | ICD-10-CM | POA: Diagnosis not present

## 2022-12-03 DIAGNOSIS — Z0389 Encounter for observation for other suspected diseases and conditions ruled out: Secondary | ICD-10-CM | POA: Diagnosis not present

## 2022-12-03 DIAGNOSIS — E8721 Acute metabolic acidosis: Secondary | ICD-10-CM | POA: Diagnosis not present

## 2022-12-03 DIAGNOSIS — I371 Nonrheumatic pulmonary valve insufficiency: Secondary | ICD-10-CM | POA: Diagnosis not present

## 2022-12-03 DIAGNOSIS — D72829 Elevated white blood cell count, unspecified: Secondary | ICD-10-CM | POA: Diagnosis not present

## 2022-12-03 DIAGNOSIS — S62615A Displaced fracture of proximal phalanx of left ring finger, initial encounter for closed fracture: Secondary | ICD-10-CM | POA: Diagnosis not present

## 2022-12-03 DIAGNOSIS — Z792 Long term (current) use of antibiotics: Secondary | ICD-10-CM | POA: Diagnosis not present

## 2022-12-03 DIAGNOSIS — D62 Acute posthemorrhagic anemia: Secondary | ICD-10-CM | POA: Diagnosis not present

## 2022-12-03 DIAGNOSIS — I82613 Acute embolism and thrombosis of superficial veins of upper extremity, bilateral: Secondary | ICD-10-CM | POA: Diagnosis not present

## 2022-12-03 DIAGNOSIS — E876 Hypokalemia: Secondary | ICD-10-CM | POA: Diagnosis not present

## 2022-12-03 DIAGNOSIS — Z9189 Other specified personal risk factors, not elsewhere classified: Secondary | ICD-10-CM | POA: Diagnosis not present

## 2022-12-03 DIAGNOSIS — R918 Other nonspecific abnormal finding of lung field: Secondary | ICD-10-CM | POA: Diagnosis not present

## 2022-12-03 DIAGNOSIS — J929 Pleural plaque without asbestos: Secondary | ICD-10-CM | POA: Diagnosis not present

## 2022-12-03 DIAGNOSIS — S069XAA Unspecified intracranial injury with loss of consciousness status unknown, initial encounter: Secondary | ICD-10-CM | POA: Diagnosis not present

## 2022-12-03 DIAGNOSIS — E669 Obesity, unspecified: Secondary | ICD-10-CM | POA: Diagnosis not present

## 2022-12-03 DIAGNOSIS — R7881 Bacteremia: Secondary | ICD-10-CM | POA: Diagnosis not present

## 2022-12-03 DIAGNOSIS — S62613D Displaced fracture of proximal phalanx of left middle finger, subsequent encounter for fracture with routine healing: Secondary | ICD-10-CM | POA: Diagnosis not present

## 2022-12-03 DIAGNOSIS — B9561 Methicillin susceptible Staphylococcus aureus infection as the cause of diseases classified elsewhere: Secondary | ICD-10-CM | POA: Diagnosis not present

## 2022-12-03 DIAGNOSIS — B954 Other streptococcus as the cause of diseases classified elsewhere: Secondary | ICD-10-CM | POA: Diagnosis not present

## 2022-12-03 DIAGNOSIS — G8918 Other acute postprocedural pain: Secondary | ICD-10-CM | POA: Diagnosis not present

## 2022-12-03 DIAGNOSIS — J948 Other specified pleural conditions: Secondary | ICD-10-CM | POA: Diagnosis not present

## 2022-12-03 DIAGNOSIS — R569 Unspecified convulsions: Secondary | ICD-10-CM | POA: Diagnosis not present

## 2022-12-03 DIAGNOSIS — J90 Pleural effusion, not elsewhere classified: Secondary | ICD-10-CM | POA: Diagnosis not present

## 2022-12-03 DIAGNOSIS — J869 Pyothorax without fistula: Secondary | ICD-10-CM | POA: Diagnosis not present

## 2022-12-03 DIAGNOSIS — S270XXA Traumatic pneumothorax, initial encounter: Secondary | ICD-10-CM | POA: Diagnosis not present

## 2022-12-03 DIAGNOSIS — A4101 Sepsis due to Methicillin susceptible Staphylococcus aureus: Secondary | ICD-10-CM | POA: Diagnosis not present

## 2022-12-03 DIAGNOSIS — R0689 Other abnormalities of breathing: Secondary | ICD-10-CM | POA: Diagnosis not present

## 2022-12-03 DIAGNOSIS — S62617A Displaced fracture of proximal phalanx of left little finger, initial encounter for closed fracture: Secondary | ICD-10-CM | POA: Diagnosis not present

## 2022-12-03 DIAGNOSIS — S62611A Displaced fracture of proximal phalanx of left index finger, initial encounter for closed fracture: Secondary | ICD-10-CM | POA: Diagnosis not present

## 2022-12-03 DIAGNOSIS — Z86718 Personal history of other venous thrombosis and embolism: Secondary | ICD-10-CM | POA: Diagnosis not present

## 2022-12-03 DIAGNOSIS — S62613A Displaced fracture of proximal phalanx of left middle finger, initial encounter for closed fracture: Secondary | ICD-10-CM | POA: Diagnosis not present

## 2022-12-03 DIAGNOSIS — R509 Fever, unspecified: Secondary | ICD-10-CM | POA: Diagnosis not present

## 2022-12-03 DIAGNOSIS — J439 Emphysema, unspecified: Secondary | ICD-10-CM | POA: Diagnosis not present

## 2022-12-03 DIAGNOSIS — J939 Pneumothorax, unspecified: Secondary | ICD-10-CM | POA: Diagnosis not present

## 2022-12-03 DIAGNOSIS — S069X0A Unspecified intracranial injury without loss of consciousness, initial encounter: Secondary | ICD-10-CM | POA: Diagnosis not present

## 2022-12-03 DIAGNOSIS — T8579XA Infection and inflammatory reaction due to other internal prosthetic devices, implants and grafts, initial encounter: Secondary | ICD-10-CM | POA: Diagnosis not present

## 2022-12-03 DIAGNOSIS — Z1152 Encounter for screening for COVID-19: Secondary | ICD-10-CM | POA: Diagnosis not present

## 2022-12-03 DIAGNOSIS — J9811 Atelectasis: Secondary | ICD-10-CM | POA: Diagnosis not present

## 2022-12-03 DIAGNOSIS — S62667A Nondisplaced fracture of distal phalanx of left little finger, initial encounter for closed fracture: Secondary | ICD-10-CM | POA: Diagnosis not present

## 2022-12-03 DIAGNOSIS — Z5332 Thoracoscopic surgical procedure converted to open procedure: Secondary | ICD-10-CM | POA: Diagnosis not present

## 2022-12-04 DIAGNOSIS — J869 Pyothorax without fistula: Secondary | ICD-10-CM | POA: Diagnosis not present

## 2022-12-04 DIAGNOSIS — G8918 Other acute postprocedural pain: Secondary | ICD-10-CM | POA: Diagnosis not present

## 2022-12-04 DIAGNOSIS — S62615A Displaced fracture of proximal phalanx of left ring finger, initial encounter for closed fracture: Secondary | ICD-10-CM | POA: Diagnosis not present

## 2022-12-04 DIAGNOSIS — Z0389 Encounter for observation for other suspected diseases and conditions ruled out: Secondary | ICD-10-CM | POA: Diagnosis not present

## 2022-12-04 DIAGNOSIS — R918 Other nonspecific abnormal finding of lung field: Secondary | ICD-10-CM | POA: Diagnosis not present

## 2022-12-04 DIAGNOSIS — Z5332 Thoracoscopic surgical procedure converted to open procedure: Secondary | ICD-10-CM | POA: Diagnosis not present

## 2022-12-04 DIAGNOSIS — S62613A Displaced fracture of proximal phalanx of left middle finger, initial encounter for closed fracture: Secondary | ICD-10-CM | POA: Diagnosis not present

## 2022-12-04 DIAGNOSIS — J984 Other disorders of lung: Secondary | ICD-10-CM | POA: Diagnosis not present

## 2022-12-04 DIAGNOSIS — J948 Other specified pleural conditions: Secondary | ICD-10-CM | POA: Diagnosis not present

## 2022-12-04 DIAGNOSIS — J90 Pleural effusion, not elsewhere classified: Secondary | ICD-10-CM | POA: Diagnosis not present

## 2022-12-04 DIAGNOSIS — D62 Acute posthemorrhagic anemia: Secondary | ICD-10-CM | POA: Diagnosis not present

## 2022-12-04 DIAGNOSIS — R0689 Other abnormalities of breathing: Secondary | ICD-10-CM | POA: Diagnosis not present

## 2022-12-04 DIAGNOSIS — J929 Pleural plaque without asbestos: Secondary | ICD-10-CM | POA: Diagnosis not present

## 2022-12-04 DIAGNOSIS — E8721 Acute metabolic acidosis: Secondary | ICD-10-CM | POA: Diagnosis not present

## 2022-12-04 DIAGNOSIS — S62667A Nondisplaced fracture of distal phalanx of left little finger, initial encounter for closed fracture: Secondary | ICD-10-CM | POA: Diagnosis not present

## 2022-12-05 DIAGNOSIS — R0689 Other abnormalities of breathing: Secondary | ICD-10-CM | POA: Diagnosis not present

## 2022-12-05 DIAGNOSIS — D62 Acute posthemorrhagic anemia: Secondary | ICD-10-CM | POA: Diagnosis not present

## 2022-12-05 DIAGNOSIS — S62613A Displaced fracture of proximal phalanx of left middle finger, initial encounter for closed fracture: Secondary | ICD-10-CM | POA: Diagnosis not present

## 2022-12-05 DIAGNOSIS — I82613 Acute embolism and thrombosis of superficial veins of upper extremity, bilateral: Secondary | ICD-10-CM | POA: Diagnosis not present

## 2022-12-05 DIAGNOSIS — B9561 Methicillin susceptible Staphylococcus aureus infection as the cause of diseases classified elsewhere: Secondary | ICD-10-CM | POA: Diagnosis not present

## 2022-12-05 DIAGNOSIS — R000 Tachycardia, unspecified: Secondary | ICD-10-CM | POA: Diagnosis not present

## 2022-12-05 DIAGNOSIS — R936 Abnormal findings on diagnostic imaging of limbs: Secondary | ICD-10-CM | POA: Diagnosis not present

## 2022-12-05 DIAGNOSIS — I371 Nonrheumatic pulmonary valve insufficiency: Secondary | ICD-10-CM | POA: Diagnosis not present

## 2022-12-05 DIAGNOSIS — R7881 Bacteremia: Secondary | ICD-10-CM | POA: Diagnosis not present

## 2022-12-05 DIAGNOSIS — J90 Pleural effusion, not elsewhere classified: Secondary | ICD-10-CM | POA: Diagnosis not present

## 2022-12-05 DIAGNOSIS — E8721 Acute metabolic acidosis: Secondary | ICD-10-CM | POA: Diagnosis not present

## 2022-12-05 DIAGNOSIS — J869 Pyothorax without fistula: Secondary | ICD-10-CM | POA: Diagnosis not present

## 2022-12-05 DIAGNOSIS — G8918 Other acute postprocedural pain: Secondary | ICD-10-CM | POA: Diagnosis not present

## 2022-12-05 DIAGNOSIS — J9811 Atelectasis: Secondary | ICD-10-CM | POA: Diagnosis not present

## 2022-12-05 DIAGNOSIS — S62615A Displaced fracture of proximal phalanx of left ring finger, initial encounter for closed fracture: Secondary | ICD-10-CM | POA: Diagnosis not present

## 2022-12-05 DIAGNOSIS — S62613D Displaced fracture of proximal phalanx of left middle finger, subsequent encounter for fracture with routine healing: Secondary | ICD-10-CM | POA: Diagnosis not present

## 2022-12-05 DIAGNOSIS — J939 Pneumothorax, unspecified: Secondary | ICD-10-CM | POA: Diagnosis not present

## 2022-12-05 DIAGNOSIS — Z86718 Personal history of other venous thrombosis and embolism: Secondary | ICD-10-CM | POA: Diagnosis not present

## 2022-12-06 DIAGNOSIS — B9561 Methicillin susceptible Staphylococcus aureus infection as the cause of diseases classified elsewhere: Secondary | ICD-10-CM | POA: Diagnosis not present

## 2022-12-06 DIAGNOSIS — D62 Acute posthemorrhagic anemia: Secondary | ICD-10-CM | POA: Diagnosis not present

## 2022-12-06 DIAGNOSIS — J90 Pleural effusion, not elsewhere classified: Secondary | ICD-10-CM | POA: Diagnosis not present

## 2022-12-06 DIAGNOSIS — J9811 Atelectasis: Secondary | ICD-10-CM | POA: Diagnosis not present

## 2022-12-06 DIAGNOSIS — E8721 Acute metabolic acidosis: Secondary | ICD-10-CM | POA: Diagnosis not present

## 2022-12-06 DIAGNOSIS — R7881 Bacteremia: Secondary | ICD-10-CM | POA: Diagnosis not present

## 2022-12-06 DIAGNOSIS — J939 Pneumothorax, unspecified: Secondary | ICD-10-CM | POA: Diagnosis not present

## 2022-12-06 DIAGNOSIS — G8918 Other acute postprocedural pain: Secondary | ICD-10-CM | POA: Diagnosis not present

## 2022-12-06 DIAGNOSIS — J869 Pyothorax without fistula: Secondary | ICD-10-CM | POA: Diagnosis not present

## 2022-12-07 DIAGNOSIS — J90 Pleural effusion, not elsewhere classified: Secondary | ICD-10-CM | POA: Diagnosis not present

## 2022-12-07 DIAGNOSIS — J9811 Atelectasis: Secondary | ICD-10-CM | POA: Diagnosis not present

## 2022-12-07 DIAGNOSIS — J939 Pneumothorax, unspecified: Secondary | ICD-10-CM | POA: Diagnosis not present

## 2022-12-08 DIAGNOSIS — S62613A Displaced fracture of proximal phalanx of left middle finger, initial encounter for closed fracture: Secondary | ICD-10-CM | POA: Diagnosis not present

## 2022-12-08 DIAGNOSIS — S62615A Displaced fracture of proximal phalanx of left ring finger, initial encounter for closed fracture: Secondary | ICD-10-CM | POA: Diagnosis not present

## 2022-12-08 DIAGNOSIS — S62617A Displaced fracture of proximal phalanx of left little finger, initial encounter for closed fracture: Secondary | ICD-10-CM | POA: Diagnosis not present

## 2022-12-08 DIAGNOSIS — S62611A Displaced fracture of proximal phalanx of left index finger, initial encounter for closed fracture: Secondary | ICD-10-CM | POA: Diagnosis not present

## 2022-12-09 DIAGNOSIS — Z9189 Other specified personal risk factors, not elsewhere classified: Secondary | ICD-10-CM | POA: Diagnosis not present

## 2022-12-09 DIAGNOSIS — Z792 Long term (current) use of antibiotics: Secondary | ICD-10-CM | POA: Diagnosis not present

## 2022-12-09 DIAGNOSIS — A4101 Sepsis due to Methicillin susceptible Staphylococcus aureus: Secondary | ICD-10-CM | POA: Diagnosis not present

## 2022-12-09 DIAGNOSIS — J869 Pyothorax without fistula: Secondary | ICD-10-CM | POA: Diagnosis not present

## 2022-12-12 DIAGNOSIS — B954 Other streptococcus as the cause of diseases classified elsewhere: Secondary | ICD-10-CM | POA: Diagnosis not present

## 2022-12-12 DIAGNOSIS — J869 Pyothorax without fistula: Secondary | ICD-10-CM | POA: Diagnosis not present

## 2022-12-14 DIAGNOSIS — B954 Other streptococcus as the cause of diseases classified elsewhere: Secondary | ICD-10-CM | POA: Diagnosis not present

## 2022-12-14 DIAGNOSIS — J869 Pyothorax without fistula: Secondary | ICD-10-CM | POA: Diagnosis not present

## 2022-12-20 DIAGNOSIS — B9561 Methicillin susceptible Staphylococcus aureus infection as the cause of diseases classified elsewhere: Secondary | ICD-10-CM | POA: Diagnosis not present

## 2022-12-20 DIAGNOSIS — J869 Pyothorax without fistula: Secondary | ICD-10-CM | POA: Diagnosis not present

## 2022-12-23 DIAGNOSIS — S62635D Displaced fracture of distal phalanx of left ring finger, subsequent encounter for fracture with routine healing: Secondary | ICD-10-CM | POA: Diagnosis not present

## 2022-12-23 DIAGNOSIS — S62623D Displaced fracture of medial phalanx of left middle finger, subsequent encounter for fracture with routine healing: Secondary | ICD-10-CM | POA: Diagnosis not present

## 2022-12-23 DIAGNOSIS — B954 Other streptococcus as the cause of diseases classified elsewhere: Secondary | ICD-10-CM | POA: Diagnosis not present

## 2022-12-23 DIAGNOSIS — M25642 Stiffness of left hand, not elsewhere classified: Secondary | ICD-10-CM | POA: Diagnosis not present

## 2022-12-23 DIAGNOSIS — J869 Pyothorax without fistula: Secondary | ICD-10-CM | POA: Diagnosis not present

## 2022-12-23 DIAGNOSIS — S62615D Displaced fracture of proximal phalanx of left ring finger, subsequent encounter for fracture with routine healing: Secondary | ICD-10-CM | POA: Diagnosis not present

## 2022-12-23 DIAGNOSIS — S6292XD Unspecified fracture of left wrist and hand, subsequent encounter for fracture with routine healing: Secondary | ICD-10-CM | POA: Diagnosis not present

## 2022-12-27 DIAGNOSIS — Z792 Long term (current) use of antibiotics: Secondary | ICD-10-CM | POA: Diagnosis not present

## 2022-12-27 DIAGNOSIS — J9811 Atelectasis: Secondary | ICD-10-CM | POA: Diagnosis not present

## 2022-12-27 DIAGNOSIS — J90 Pleural effusion, not elsewhere classified: Secondary | ICD-10-CM | POA: Diagnosis not present

## 2022-12-27 DIAGNOSIS — R7881 Bacteremia: Secondary | ICD-10-CM | POA: Diagnosis not present

## 2022-12-27 DIAGNOSIS — J986 Disorders of diaphragm: Secondary | ICD-10-CM | POA: Diagnosis not present

## 2022-12-27 DIAGNOSIS — J869 Pyothorax without fistula: Secondary | ICD-10-CM | POA: Diagnosis not present

## 2022-12-27 DIAGNOSIS — B9561 Methicillin susceptible Staphylococcus aureus infection as the cause of diseases classified elsewhere: Secondary | ICD-10-CM | POA: Diagnosis not present

## 2022-12-30 DIAGNOSIS — M25642 Stiffness of left hand, not elsewhere classified: Secondary | ICD-10-CM | POA: Diagnosis not present

## 2022-12-30 DIAGNOSIS — S62615D Displaced fracture of proximal phalanx of left ring finger, subsequent encounter for fracture with routine healing: Secondary | ICD-10-CM | POA: Diagnosis not present

## 2022-12-31 DIAGNOSIS — J869 Pyothorax without fistula: Secondary | ICD-10-CM | POA: Diagnosis not present

## 2022-12-31 DIAGNOSIS — B954 Other streptococcus as the cause of diseases classified elsewhere: Secondary | ICD-10-CM | POA: Diagnosis not present

## 2022-12-31 DIAGNOSIS — B9562 Methicillin resistant Staphylococcus aureus infection as the cause of diseases classified elsewhere: Secondary | ICD-10-CM | POA: Diagnosis not present

## 2023-01-02 DIAGNOSIS — M25642 Stiffness of left hand, not elsewhere classified: Secondary | ICD-10-CM | POA: Diagnosis not present

## 2023-01-02 DIAGNOSIS — S62615D Displaced fracture of proximal phalanx of left ring finger, subsequent encounter for fracture with routine healing: Secondary | ICD-10-CM | POA: Diagnosis not present

## 2023-01-03 DIAGNOSIS — J869 Pyothorax without fistula: Secondary | ICD-10-CM | POA: Diagnosis not present

## 2023-01-03 DIAGNOSIS — B9561 Methicillin susceptible Staphylococcus aureus infection as the cause of diseases classified elsewhere: Secondary | ICD-10-CM | POA: Diagnosis not present

## 2023-01-05 DIAGNOSIS — B9562 Methicillin resistant Staphylococcus aureus infection as the cause of diseases classified elsewhere: Secondary | ICD-10-CM | POA: Diagnosis not present

## 2023-01-05 DIAGNOSIS — B954 Other streptococcus as the cause of diseases classified elsewhere: Secondary | ICD-10-CM | POA: Diagnosis not present

## 2023-01-05 DIAGNOSIS — J869 Pyothorax without fistula: Secondary | ICD-10-CM | POA: Diagnosis not present

## 2023-01-10 DIAGNOSIS — S6292XD Unspecified fracture of left wrist and hand, subsequent encounter for fracture with routine healing: Secondary | ICD-10-CM | POA: Diagnosis not present

## 2023-01-10 DIAGNOSIS — S62605D Fracture of unspecified phalanx of left ring finger, subsequent encounter for fracture with routine healing: Secondary | ICD-10-CM | POA: Diagnosis not present

## 2023-01-10 DIAGNOSIS — S62615D Displaced fracture of proximal phalanx of left ring finger, subsequent encounter for fracture with routine healing: Secondary | ICD-10-CM | POA: Diagnosis not present

## 2023-01-10 DIAGNOSIS — J869 Pyothorax without fistula: Secondary | ICD-10-CM | POA: Diagnosis not present

## 2023-01-10 DIAGNOSIS — B954 Other streptococcus as the cause of diseases classified elsewhere: Secondary | ICD-10-CM | POA: Diagnosis not present

## 2023-01-10 DIAGNOSIS — I119 Hypertensive heart disease without heart failure: Secondary | ICD-10-CM | POA: Diagnosis not present

## 2023-01-10 DIAGNOSIS — B9562 Methicillin resistant Staphylococcus aureus infection as the cause of diseases classified elsewhere: Secondary | ICD-10-CM | POA: Diagnosis not present

## 2023-01-11 DIAGNOSIS — J929 Pleural plaque without asbestos: Secondary | ICD-10-CM | POA: Diagnosis not present

## 2023-01-11 DIAGNOSIS — Z9889 Other specified postprocedural states: Secondary | ICD-10-CM | POA: Diagnosis not present

## 2023-01-11 DIAGNOSIS — J869 Pyothorax without fistula: Secondary | ICD-10-CM | POA: Diagnosis not present

## 2023-01-11 DIAGNOSIS — J9811 Atelectasis: Secondary | ICD-10-CM | POA: Diagnosis not present

## 2023-01-11 DIAGNOSIS — R918 Other nonspecific abnormal finding of lung field: Secondary | ICD-10-CM | POA: Diagnosis not present

## 2023-01-13 DIAGNOSIS — Z452 Encounter for adjustment and management of vascular access device: Secondary | ICD-10-CM | POA: Diagnosis not present

## 2023-01-17 DIAGNOSIS — J869 Pyothorax without fistula: Secondary | ICD-10-CM | POA: Diagnosis not present

## 2023-01-27 DIAGNOSIS — S62615D Displaced fracture of proximal phalanx of left ring finger, subsequent encounter for fracture with routine healing: Secondary | ICD-10-CM | POA: Diagnosis not present

## 2023-01-27 DIAGNOSIS — M25642 Stiffness of left hand, not elsewhere classified: Secondary | ICD-10-CM | POA: Diagnosis not present

## 2023-01-31 DIAGNOSIS — S62615D Displaced fracture of proximal phalanx of left ring finger, subsequent encounter for fracture with routine healing: Secondary | ICD-10-CM | POA: Diagnosis not present

## 2023-01-31 DIAGNOSIS — S62613D Displaced fracture of proximal phalanx of left middle finger, subsequent encounter for fracture with routine healing: Secondary | ICD-10-CM | POA: Diagnosis not present

## 2023-01-31 DIAGNOSIS — S6292XD Unspecified fracture of left wrist and hand, subsequent encounter for fracture with routine healing: Secondary | ICD-10-CM | POA: Diagnosis not present

## 2023-01-31 DIAGNOSIS — Z9889 Other specified postprocedural states: Secondary | ICD-10-CM | POA: Diagnosis not present

## 2023-02-01 DIAGNOSIS — M25642 Stiffness of left hand, not elsewhere classified: Secondary | ICD-10-CM | POA: Diagnosis not present

## 2023-02-01 DIAGNOSIS — S62615D Displaced fracture of proximal phalanx of left ring finger, subsequent encounter for fracture with routine healing: Secondary | ICD-10-CM | POA: Diagnosis not present

## 2023-02-10 DIAGNOSIS — M25642 Stiffness of left hand, not elsewhere classified: Secondary | ICD-10-CM | POA: Diagnosis not present

## 2023-02-10 DIAGNOSIS — S62615D Displaced fracture of proximal phalanx of left ring finger, subsequent encounter for fracture with routine healing: Secondary | ICD-10-CM | POA: Diagnosis not present

## 2023-02-17 DIAGNOSIS — S62615D Displaced fracture of proximal phalanx of left ring finger, subsequent encounter for fracture with routine healing: Secondary | ICD-10-CM | POA: Diagnosis not present

## 2023-02-17 DIAGNOSIS — M25642 Stiffness of left hand, not elsewhere classified: Secondary | ICD-10-CM | POA: Diagnosis not present

## 2023-02-24 DIAGNOSIS — S62615D Displaced fracture of proximal phalanx of left ring finger, subsequent encounter for fracture with routine healing: Secondary | ICD-10-CM | POA: Diagnosis not present

## 2023-02-24 DIAGNOSIS — M25642 Stiffness of left hand, not elsewhere classified: Secondary | ICD-10-CM | POA: Diagnosis not present

## 2023-02-28 DIAGNOSIS — S6292XD Unspecified fracture of left wrist and hand, subsequent encounter for fracture with routine healing: Secondary | ICD-10-CM | POA: Diagnosis not present

## 2023-02-28 DIAGNOSIS — M25642 Stiffness of left hand, not elsewhere classified: Secondary | ICD-10-CM | POA: Diagnosis not present

## 2023-02-28 DIAGNOSIS — S62613D Displaced fracture of proximal phalanx of left middle finger, subsequent encounter for fracture with routine healing: Secondary | ICD-10-CM | POA: Diagnosis not present

## 2023-02-28 DIAGNOSIS — S62615D Displaced fracture of proximal phalanx of left ring finger, subsequent encounter for fracture with routine healing: Secondary | ICD-10-CM | POA: Diagnosis not present

## 2023-03-03 DIAGNOSIS — S62615D Displaced fracture of proximal phalanx of left ring finger, subsequent encounter for fracture with routine healing: Secondary | ICD-10-CM | POA: Diagnosis not present

## 2023-03-03 DIAGNOSIS — M25642 Stiffness of left hand, not elsewhere classified: Secondary | ICD-10-CM | POA: Diagnosis not present

## 2023-03-07 DIAGNOSIS — M25642 Stiffness of left hand, not elsewhere classified: Secondary | ICD-10-CM | POA: Diagnosis not present

## 2023-03-07 DIAGNOSIS — S62615D Displaced fracture of proximal phalanx of left ring finger, subsequent encounter for fracture with routine healing: Secondary | ICD-10-CM | POA: Diagnosis not present

## 2023-03-10 DIAGNOSIS — M25642 Stiffness of left hand, not elsewhere classified: Secondary | ICD-10-CM | POA: Diagnosis not present

## 2023-03-10 DIAGNOSIS — S62615D Displaced fracture of proximal phalanx of left ring finger, subsequent encounter for fracture with routine healing: Secondary | ICD-10-CM | POA: Diagnosis not present

## 2023-05-01 DIAGNOSIS — M25642 Stiffness of left hand, not elsewhere classified: Secondary | ICD-10-CM | POA: Diagnosis not present

## 2023-05-01 DIAGNOSIS — S62615D Displaced fracture of proximal phalanx of left ring finger, subsequent encounter for fracture with routine healing: Secondary | ICD-10-CM | POA: Diagnosis not present

## 2023-05-22 DIAGNOSIS — S62615D Displaced fracture of proximal phalanx of left ring finger, subsequent encounter for fracture with routine healing: Secondary | ICD-10-CM | POA: Diagnosis not present

## 2023-05-22 DIAGNOSIS — S93601A Unspecified sprain of right foot, initial encounter: Secondary | ICD-10-CM | POA: Diagnosis not present

## 2023-05-22 DIAGNOSIS — M79671 Pain in right foot: Secondary | ICD-10-CM | POA: Diagnosis not present

## 2023-05-22 DIAGNOSIS — M25642 Stiffness of left hand, not elsewhere classified: Secondary | ICD-10-CM | POA: Diagnosis not present

## 2023-05-28 DIAGNOSIS — R011 Cardiac murmur, unspecified: Secondary | ICD-10-CM | POA: Diagnosis not present

## 2023-05-28 DIAGNOSIS — S93402A Sprain of unspecified ligament of left ankle, initial encounter: Secondary | ICD-10-CM | POA: Diagnosis not present

## 2023-05-28 DIAGNOSIS — S99912A Unspecified injury of left ankle, initial encounter: Secondary | ICD-10-CM | POA: Diagnosis not present

## 2023-05-28 DIAGNOSIS — Z7901 Long term (current) use of anticoagulants: Secondary | ICD-10-CM | POA: Diagnosis not present

## 2023-05-28 DIAGNOSIS — S99922A Unspecified injury of left foot, initial encounter: Secondary | ICD-10-CM | POA: Diagnosis not present

## 2023-06-12 DIAGNOSIS — S62615D Displaced fracture of proximal phalanx of left ring finger, subsequent encounter for fracture with routine healing: Secondary | ICD-10-CM | POA: Diagnosis not present

## 2023-06-12 DIAGNOSIS — M25642 Stiffness of left hand, not elsewhere classified: Secondary | ICD-10-CM | POA: Diagnosis not present

## 2023-08-07 DIAGNOSIS — M25642 Stiffness of left hand, not elsewhere classified: Secondary | ICD-10-CM | POA: Diagnosis not present

## 2023-08-07 DIAGNOSIS — S62615D Displaced fracture of proximal phalanx of left ring finger, subsequent encounter for fracture with routine healing: Secondary | ICD-10-CM | POA: Diagnosis not present

## 2023-09-07 DIAGNOSIS — Z202 Contact with and (suspected) exposure to infections with a predominantly sexual mode of transmission: Secondary | ICD-10-CM | POA: Diagnosis not present

## 2023-12-04 DIAGNOSIS — Z131 Encounter for screening for diabetes mellitus: Secondary | ICD-10-CM | POA: Diagnosis not present

## 2023-12-04 DIAGNOSIS — Z0000 Encounter for general adult medical examination without abnormal findings: Secondary | ICD-10-CM | POA: Diagnosis not present

## 2023-12-04 DIAGNOSIS — R061 Stridor: Secondary | ICD-10-CM | POA: Diagnosis not present

## 2023-12-04 DIAGNOSIS — Z1321 Encounter for screening for nutritional disorder: Secondary | ICD-10-CM | POA: Diagnosis not present

## 2023-12-04 DIAGNOSIS — Z113 Encounter for screening for infections with a predominantly sexual mode of transmission: Secondary | ICD-10-CM | POA: Diagnosis not present

## 2023-12-04 DIAGNOSIS — Z1329 Encounter for screening for other suspected endocrine disorder: Secondary | ICD-10-CM | POA: Diagnosis not present

## 2023-12-04 DIAGNOSIS — R0602 Shortness of breath: Secondary | ICD-10-CM | POA: Diagnosis not present

## 2023-12-04 DIAGNOSIS — Z8709 Personal history of other diseases of the respiratory system: Secondary | ICD-10-CM | POA: Diagnosis not present

## 2023-12-04 DIAGNOSIS — Z136 Encounter for screening for cardiovascular disorders: Secondary | ICD-10-CM | POA: Diagnosis not present

## 2023-12-04 DIAGNOSIS — Z13220 Encounter for screening for lipoid disorders: Secondary | ICD-10-CM | POA: Diagnosis not present

## 2023-12-04 DIAGNOSIS — Z130 Encounter for screening for diseases of the blood and blood-forming organs and certain disorders involving the immune mechanism: Secondary | ICD-10-CM | POA: Diagnosis not present

## 2024-03-25 DIAGNOSIS — N341 Nonspecific urethritis: Secondary | ICD-10-CM | POA: Diagnosis not present

## 2024-03-28 DIAGNOSIS — K1379 Other lesions of oral mucosa: Secondary | ICD-10-CM | POA: Diagnosis not present
